# Patient Record
Sex: Female | Born: 1935 | Race: White | Hispanic: No | Smoking: Never smoker
Health system: Southern US, Community
[De-identification: ages and names within clinical notes are randomized; demographics above are authoritative.]

## PROBLEM LIST (undated history)

## (undated) DIAGNOSIS — I872 Venous insufficiency (chronic) (peripheral): Secondary | ICD-10-CM

## (undated) DIAGNOSIS — F419 Anxiety disorder, unspecified: Secondary | ICD-10-CM

## (undated) DIAGNOSIS — H547 Unspecified visual loss: Secondary | ICD-10-CM

## (undated) DIAGNOSIS — I10 Essential (primary) hypertension: Secondary | ICD-10-CM

## (undated) DIAGNOSIS — F32A Depression, unspecified: Secondary | ICD-10-CM

## (undated) DIAGNOSIS — M1990 Unspecified osteoarthritis, unspecified site: Secondary | ICD-10-CM

## (undated) DIAGNOSIS — C50919 Malignant neoplasm of unspecified site of unspecified female breast: Secondary | ICD-10-CM

## (undated) DIAGNOSIS — E119 Type 2 diabetes mellitus without complications: Secondary | ICD-10-CM

## (undated) DIAGNOSIS — I2510 Atherosclerotic heart disease of native coronary artery without angina pectoris: Secondary | ICD-10-CM

## (undated) DIAGNOSIS — E785 Hyperlipidemia, unspecified: Secondary | ICD-10-CM

## (undated) DIAGNOSIS — G629 Polyneuropathy, unspecified: Secondary | ICD-10-CM

## (undated) DIAGNOSIS — E11319 Type 2 diabetes mellitus with unspecified diabetic retinopathy without macular edema: Secondary | ICD-10-CM

## (undated) DIAGNOSIS — H409 Unspecified glaucoma: Secondary | ICD-10-CM

## (undated) DIAGNOSIS — C801 Malignant (primary) neoplasm, unspecified: Secondary | ICD-10-CM

## (undated) HISTORY — PX: CHOLECYSTECTOMY: SHX55

## (undated) HISTORY — DX: Anxiety disorder, unspecified: F41.9

## (undated) HISTORY — DX: Unspecified visual loss: H54.7

## (undated) HISTORY — PX: EYE SURGERY: SHX253

## (undated) HISTORY — DX: Depression, unspecified: F32.A

## (undated) HISTORY — DX: Unspecified osteoarthritis, unspecified site: M19.90

## (undated) HISTORY — DX: Atherosclerotic heart disease of native coronary artery without angina pectoris: I25.10

## (undated) HISTORY — DX: Polyneuropathy, unspecified: G62.9

## (undated) HISTORY — PX: TOE AMPUTATION: SHX809

## (undated) HISTORY — DX: Hyperlipidemia, unspecified: E78.5

## (undated) HISTORY — DX: Venous insufficiency (chronic) (peripheral): I87.2

## (undated) HISTORY — PX: BREAST SURGERY: SHX581

## (undated) HISTORY — DX: Malignant neoplasm of unspecified site of unspecified female breast: C50.919

## (undated) HISTORY — DX: Essential (primary) hypertension: I10

## (undated) HISTORY — DX: Type 2 diabetes mellitus with unspecified diabetic retinopathy without macular edema: E11.319

## (undated) HISTORY — DX: Type 2 diabetes mellitus without complications: E11.9

---

## 2003-05-06 DIAGNOSIS — C801 Malignant (primary) neoplasm, unspecified: Secondary | ICD-10-CM

## 2003-05-06 HISTORY — DX: Malignant (primary) neoplasm, unspecified: C80.1

## 2008-06-27 ENCOUNTER — Observation Stay
Admission: RE | Admit: 2008-06-27 | Disposition: A | Discharge status: Home / Self Care | Payer: Self-pay | Source: Ambulatory Visit | Admitting: Obstetrics and Gynecology

## 2008-06-27 LAB — PERSONNEL HEALTH HCV ANTIBODY: Personel Health HCV Antibody: NEGATIVE

## 2008-06-27 LAB — HIV EXPOSURE FOR SOURCE PATIENTS, RAPID: HIV Source: NONREACTIVE

## 2008-06-27 LAB — PERSONNEL HEALTH HBSAG: Personel Health HBS AG: NEGATIVE

## 2008-09-28 ENCOUNTER — Ambulatory Visit
Admit: 2008-09-28 | Disposition: A | Discharge status: Home / Self Care | Payer: Self-pay | Source: Ambulatory Visit | Admitting: Obstetrics and Gynecology

## 2011-02-21 NOTE — Op Note (Unsigned)
Account Number: [id number]      Document ID: [id number]      Admit Date: 06/27/2008      Procedure Date: 06/27/2008            Patient Location: [id number]      Patient Type: V            SURGEON: Maryjean Ka MD      ASSISTANT:                  PREOPERATIVE DIAGNOSIS:      77 year old with left Bartholin's cyst, possible abscess mass for excision.            POSTOPERATIVE DIAGNOSIS:      Left Bartholin gland and endometrioma excision.            ANESTHESIA:      General.            TITLE OF PROCEDURE:      Excision of left Bartholin gland and cyst, possible endometrioma.            DESCRIPTION OF PROCEDURE:      The patient was placed in a modified lithotomy position.  The part was      cleaned and draped in the usual sterile fashion and examination under      anesthesia revealed large bulbous mass in the location of the Bartholin      gland as well as anteriorly in the labia.  A curvilinear incision was made      at the junction of the labia minora and majora boarding the introitus.      This was about 4 cm.  This was deepened and a plane of the Bartholin cyst      was encountered.  At first it was seen that there were 2 cysts, one in the      upper part and then in the lower part it appeared to be possibly connected,      the upper lobe of the cyst, it appeared to be a bilobed cyst.  The upper      lobe was enucleated by meticulous sharp dissection using a knife and fine      Metzenbaum scissors.  The plane of dissection was obtained, curvilinear,      the cyst was enucleated from the labia majora and minora in the upper part      and as this was posteriorly deepened the lower pole of the cyst was      encountered.  This was enucleated from the labia laterally on the left side      and medially a cyst dissection was done.  The cyst was ruptured and dark      chocolate-colored material was obtained suggesting that the diagnosis of an      endometrioma.  This was held with a Kelly clamp and a further dissection      was  continued by sharp dissection using sharp Metzenbaum scissors.  As the      posterior dissection was continued pedicles to this mass were encountered.      These were clamped using fine Kelly clamps and cutting off with the      scissors, and tying off with sutures of 2-0 Vicryl and a transfixation      sutures.  Complete hemostasis was assured.  Finally, the cyst was removed.      This meticulous dissection was required at least 2 hours of  work.  Once  this was done, the bed of the cyst was encountered.  Complete hemostasis      was assured and the bed was closed with figure-of-eight stitches.  Finally      the vaginal mucosa was reapproximated with a continuous suture of 2-0      Vicryl on an SH suture.  Complete hemostasis was assured, a Foley catheter      was passed.  In view of the massive dissection and deep intrusion of this      Bartholin's gland oblique endometrioma the patient was admitted for 23-hour      observation period and will be discharged home tomorrow.                              _______________________________     Date/Time Signed: _____________      Maryjean Ka MD [phone])            D:  06/27/2008 22:31 PM by Dr. Denita Lung. Thedore Mins, MD [phone])      T:  06/28/2008 11:02 AM by Ottis Stain          (Conf: [id number]) (Doc ID: [id number])                  NU:[id number] Thedore Mins MD

## 2011-05-27 ENCOUNTER — Encounter (INDEPENDENT_AMBULATORY_CARE_PROVIDER_SITE_OTHER): Payer: Self-pay

## 2014-08-25 ENCOUNTER — Encounter (HOSPITAL_BASED_OUTPATIENT_CLINIC_OR_DEPARTMENT_OTHER): Payer: Self-pay | Admitting: *Deleted

## 2014-08-25 ENCOUNTER — Emergency Department (HOSPITAL_BASED_OUTPATIENT_CLINIC_OR_DEPARTMENT_OTHER): Payer: Medicare PPO

## 2014-08-25 ENCOUNTER — Inpatient Hospital Stay (HOSPITAL_BASED_OUTPATIENT_CLINIC_OR_DEPARTMENT_OTHER)
Admission: EM | Admit: 2014-08-25 | Discharge: 2014-08-27 | DRG: 637 | Disposition: A | Discharge status: Home Health | Payer: Medicare PPO | Attending: Internal Medicine | Admitting: Internal Medicine

## 2014-08-25 DIAGNOSIS — M1990 Unspecified osteoarthritis, unspecified site: Secondary | ICD-10-CM | POA: Diagnosis present

## 2014-08-25 DIAGNOSIS — I2510 Atherosclerotic heart disease of native coronary artery without angina pectoris: Secondary | ICD-10-CM | POA: Diagnosis present

## 2014-08-25 DIAGNOSIS — E1140 Type 2 diabetes mellitus with diabetic neuropathy, unspecified: Secondary | ICD-10-CM | POA: Diagnosis present

## 2014-08-25 DIAGNOSIS — R531 Weakness: Secondary | ICD-10-CM

## 2014-08-25 DIAGNOSIS — I10 Essential (primary) hypertension: Secondary | ICD-10-CM | POA: Diagnosis present

## 2014-08-25 DIAGNOSIS — R7989 Other specified abnormal findings of blood chemistry: Secondary | ICD-10-CM

## 2014-08-25 DIAGNOSIS — I9589 Other hypotension: Secondary | ICD-10-CM | POA: Diagnosis not present

## 2014-08-25 DIAGNOSIS — H5440 Blindness, one eye, unspecified eye: Secondary | ICD-10-CM | POA: Diagnosis present

## 2014-08-25 DIAGNOSIS — R739 Hyperglycemia, unspecified: Secondary | ICD-10-CM | POA: Diagnosis not present

## 2014-08-25 DIAGNOSIS — H409 Unspecified glaucoma: Secondary | ICD-10-CM | POA: Diagnosis present

## 2014-08-25 DIAGNOSIS — E860 Dehydration: Secondary | ICD-10-CM | POA: Diagnosis present

## 2014-08-25 DIAGNOSIS — Z79899 Other long term (current) drug therapy: Secondary | ICD-10-CM

## 2014-08-25 DIAGNOSIS — H5441 Blindness, right eye, normal vision left eye: Secondary | ICD-10-CM | POA: Diagnosis present

## 2014-08-25 DIAGNOSIS — G9340 Encephalopathy, unspecified: Secondary | ICD-10-CM | POA: Diagnosis present

## 2014-08-25 DIAGNOSIS — E11319 Type 2 diabetes mellitus with unspecified diabetic retinopathy without macular edema: Secondary | ICD-10-CM | POA: Diagnosis present

## 2014-08-25 DIAGNOSIS — N179 Acute kidney failure, unspecified: Secondary | ICD-10-CM | POA: Diagnosis present

## 2014-08-25 DIAGNOSIS — Z794 Long term (current) use of insulin: Secondary | ICD-10-CM | POA: Diagnosis not present

## 2014-08-25 DIAGNOSIS — IMO0002 Reserved for concepts with insufficient information to code with codable children: Secondary | ICD-10-CM | POA: Diagnosis present

## 2014-08-25 DIAGNOSIS — E1165 Type 2 diabetes mellitus with hyperglycemia: Principal | ICD-10-CM | POA: Diagnosis present

## 2014-08-25 HISTORY — DX: Atherosclerotic heart disease of native coronary artery without angina pectoris: I25.10

## 2014-08-25 HISTORY — DX: Unspecified glaucoma: H40.9

## 2014-08-25 HISTORY — DX: Essential (primary) hypertension: I10

## 2014-08-25 HISTORY — DX: Type 2 diabetes mellitus without complications: E11.9

## 2014-08-25 HISTORY — DX: Unspecified osteoarthritis, unspecified site: M19.90

## 2014-08-25 LAB — URINALYSIS, ROUTINE W REFLEX MICROSCOPIC
Bilirubin Urine: NEGATIVE
Glucose, UA: >1000 mg/dL — AB
Hgb urine dipstick: NEGATIVE
Ketones, ur: NEGATIVE mg/dL
LEUKOCYTES UA: NEGATIVE
NITRITE: NEGATIVE
PROTEIN: 30 mg/dL — AB
SPECIFIC GRAVITY, URINE: 1.026 (ref 1.005–1.030)
Urobilinogen, UA: 1 mg/dL (ref 0.0–1.0)
pH: 7 (ref 5.0–8.0)

## 2014-08-25 LAB — I-STAT VENOUS BLOOD GAS, ED
ACID-BASE EXCESS: 1 mmol/L (ref 0.0–2.0)
BICARBONATE: 28 meq/L — AB (ref 20.0–24.0)
O2 SAT: 17 %
Patient temperature: 98.9
TCO2: 30 mmol/L (ref 0–100)
pCO2, Ven: 54.6 mmHg — ABNORMAL HIGH (ref 45.0–50.0)
pH, Ven: 7.319 — ABNORMAL HIGH (ref 7.250–7.300)
pO2, Ven: 15 mmHg — CL (ref 30.0–45.0)

## 2014-08-25 LAB — CBC WITH DIFFERENTIAL/PLATELET
BASOS PCT: 0 % (ref 0–1)
Basophils Absolute: 0 10*3/uL (ref 0.0–0.1)
EOS ABS: 0.2 10*3/uL (ref 0.0–0.7)
Eosinophils Relative: 2 % (ref 0–5)
HEMATOCRIT: 40.3 % (ref 36.0–46.0)
HEMOGLOBIN: 13.8 g/dL (ref 12.0–15.0)
Lymphocytes Relative: 18 % (ref 12–46)
Lymphs Abs: 2.2 10*3/uL (ref 0.7–4.0)
MCH: 31.4 pg (ref 26.0–34.0)
MCHC: 34.2 g/dL (ref 30.0–36.0)
MCV: 91.8 fL (ref 78.0–100.0)
MONOS PCT: 7 % (ref 3–12)
Monocytes Absolute: 0.9 10*3/uL (ref 0.1–1.0)
NEUTROS PCT: 73 % (ref 43–77)
Neutro Abs: 9.1 10*3/uL — ABNORMAL HIGH (ref 1.7–7.7)
Platelets: 190 10*3/uL (ref 150–400)
RBC: 4.39 MIL/uL (ref 3.87–5.11)
RDW: 12.3 % (ref 11.5–15.5)
WBC: 12.4 10*3/uL — ABNORMAL HIGH (ref 4.0–10.5)

## 2014-08-25 LAB — COMPREHENSIVE METABOLIC PANEL
ALT: 19 U/L (ref 0–35)
AST: 24 U/L (ref 0–37)
Albumin: 3.8 g/dL (ref 3.5–5.2)
Alkaline Phosphatase: 109 U/L (ref 39–117)
Anion gap: 11 (ref 5–15)
BUN: 32 mg/dL — ABNORMAL HIGH (ref 6–23)
CO2: 29 mmol/L (ref 19–32)
Calcium: 9.2 mg/dL (ref 8.4–10.5)
Chloride: 84 mmol/L — ABNORMAL LOW (ref 96–112)
Creatinine, Ser: 1.32 mg/dL — ABNORMAL HIGH (ref 0.50–1.10)
GFR calc Af Amer: 43 mL/min — ABNORMAL LOW (ref >90)
GFR, EST NON AFRICAN AMERICAN: 37 mL/min — AB (ref >90)
Glucose, Bld: 575 mg/dL (ref 70–99)
Potassium: 4.8 mmol/L (ref 3.5–5.1)
SODIUM: 124 mmol/L — AB (ref 135–145)
Total Bilirubin: 0.9 mg/dL (ref 0.3–1.2)
Total Protein: 8.1 g/dL (ref 6.0–8.3)

## 2014-08-25 LAB — CBG MONITORING, ED
GLUCOSE-CAPILLARY: 546 mg/dL — AB (ref 70–99)
GLUCOSE-CAPILLARY: 407 mg/dL — AB (ref 70–99)
GLUCOSE-CAPILLARY: 212 mg/dL — AB (ref 70–99)
GLUCOSE-CAPILLARY: 141 mg/dL — AB (ref 70–99)
Glucose-Capillary: 478 mg/dL — ABNORMAL HIGH (ref 70–99)

## 2014-08-25 LAB — URINE MICROSCOPIC-ADD ON

## 2014-08-25 LAB — GLUCOSE, CAPILLARY
Glucose-Capillary: 141 mg/dL — ABNORMAL HIGH (ref 70–99)
Glucose-Capillary: 142 mg/dL — ABNORMAL HIGH (ref 70–99)
Glucose-Capillary: 209 mg/dL — ABNORMAL HIGH (ref 70–99)

## 2014-08-25 LAB — D-DIMER, QUANTITATIVE: D-Dimer, Quant: 0.65 ug/mL-FEU — ABNORMAL HIGH (ref 0.00–0.48)

## 2014-08-25 LAB — CK TOTAL AND CKMB (NOT AT ARMC)
CK TOTAL: 40 U/L (ref 7–177)
CK, MB: 2.6 ng/mL (ref 0.3–4.0)
RELATIVE INDEX: INVALID (ref 0.0–2.5)

## 2014-08-25 LAB — I-STAT CG4 LACTIC ACID, ED: LACTIC ACID, VENOUS: 2.25 mmol/L — AB (ref 0.5–2.0)

## 2014-08-25 LAB — SEDIMENTATION RATE: Sed Rate: 104 mm/hr — ABNORMAL HIGH (ref 0–22)

## 2014-08-25 LAB — TROPONIN I
Troponin I: <0.03 ng/mL (ref <0.031)
Troponin I: <0.03 ng/mL (ref <0.031)

## 2014-08-25 LAB — MRSA PCR SCREENING: MRSA by PCR: NEGATIVE

## 2014-08-25 MED ORDER — SODIUM CHLORIDE 0.9 % IV SOLN
INTRAVENOUS | Status: DC
  Administered 2014-08-25: 15:00:00 via INTRAVENOUS

## 2014-08-25 MED ORDER — SODIUM CHLORIDE 0.9 % IV SOLN
Freq: Once | INTRAVENOUS | Status: AC
  Administered 2014-08-25: 13:00:00 via INTRAVENOUS

## 2014-08-25 MED ORDER — SODIUM CHLORIDE 0.9 % IV SOLN
INTRAVENOUS | Status: DC
  Administered 2014-08-25: 4.9 [IU]/h via INTRAVENOUS

## 2014-08-25 MED ORDER — LATANOPROST 0.005 % OP SOLN
1.0000 [drp] | Freq: Every day | OPHTHALMIC | Status: DC
Start: 2014-08-25 — End: 2014-08-27
  Administered 2014-08-26 – 2014-08-27 (×2): 1 [drp] via OPHTHALMIC
  Filled 2014-08-25 (×2): qty 2.5

## 2014-08-25 MED ORDER — ACETAMINOPHEN 650 MG RE SUPP: 650.0000 mg | Freq: Four times a day (QID) | RECTAL | Status: DC | PRN

## 2014-08-25 MED ORDER — PRAVASTATIN SODIUM 10 MG PO TABS
40.0000 mg | ORAL_TABLET | Freq: Every day | ORAL | Status: DC
  Administered 2014-08-26: 40 mg via ORAL
  Filled 2014-08-25: qty 1
  Filled 2014-08-25: qty 4

## 2014-08-25 MED ORDER — DEXTROSE-NACL 5-0.45 % IV SOLN: INTRAVENOUS | Status: DC

## 2014-08-25 MED ORDER — PIPERACILLIN-TAZOBACTAM 3.375 G IVPB
3.3750 g | Freq: Three times a day (TID) | INTRAVENOUS | Status: DC
  Filled 2014-08-25 (×2): qty 50

## 2014-08-25 MED ORDER — PIPERACILLIN-TAZOBACTAM 3.375 G IVPB 30 MIN
3.3750 g | Freq: Once | INTRAVENOUS | Status: AC
  Administered 2014-08-25: 3.375 g via INTRAVENOUS
  Filled 2014-08-25 (×2): qty 50

## 2014-08-25 MED ORDER — INSULIN ASPART 100 UNIT/ML ~~LOC~~ SOLN
0.0000 [IU] | Freq: Every day | SUBCUTANEOUS | Status: DC
  Administered 2014-08-25 – 2014-08-26 (×2): 2 [IU] via SUBCUTANEOUS

## 2014-08-25 MED ORDER — SODIUM CHLORIDE 0.9 % IV BOLUS (SEPSIS)
1000.0000 mL | Freq: Once | INTRAVENOUS | Status: AC
  Administered 2014-08-25: 1000 mL via INTRAVENOUS

## 2014-08-25 MED ORDER — VANCOMYCIN HCL IN DEXTROSE 1-5 GM/200ML-% IV SOLN
1000.0000 mg | Freq: Once | INTRAVENOUS | Status: AC
Start: 2014-08-25 — End: 2014-08-25
  Administered 2014-08-25: 1000 mg via INTRAVENOUS
  Filled 2014-08-25: qty 200

## 2014-08-25 MED ORDER — SODIUM CHLORIDE 0.9 % IJ SOLN
3.0000 mL | Freq: Two times a day (BID) | INTRAMUSCULAR | Status: DC
  Administered 2014-08-25 – 2014-08-26 (×2): 3 mL via INTRAVENOUS

## 2014-08-25 MED ORDER — GABAPENTIN 600 MG PO TABS
600.0000 mg | ORAL_TABLET | Freq: Three times a day (TID) | ORAL | Status: DC
  Administered 2014-08-26 – 2014-08-27 (×5): 600 mg via ORAL
  Filled 2014-08-25 (×7): qty 1

## 2014-08-25 MED ORDER — SODIUM CHLORIDE 0.9 % IV SOLN
INTRAVENOUS | Status: AC
  Administered 2014-08-25 – 2014-08-26 (×2): via INTRAVENOUS

## 2014-08-25 MED ORDER — INSULIN ASPART 100 UNIT/ML ~~LOC~~ SOLN: 0.0000 [IU] | Freq: Three times a day (TID) | SUBCUTANEOUS | Status: DC

## 2014-08-25 MED ORDER — VANCOMYCIN HCL 10 G IV SOLR
1250.0000 mg | INTRAVENOUS | Status: DC
  Filled 2014-08-25: qty 1250

## 2014-08-25 MED ORDER — ACETAMINOPHEN 325 MG PO TABS
650.0000 mg | ORAL_TABLET | Freq: Four times a day (QID) | ORAL | Status: DC | PRN
Start: 2014-08-25 — End: 2014-08-27

## 2014-08-25 MED ORDER — INSULIN ASPART 100 UNIT/ML ~~LOC~~ SOLN
0.0000 [IU] | Freq: Three times a day (TID) | SUBCUTANEOUS | Status: DC
  Administered 2014-08-26: 3 [IU] via SUBCUTANEOUS
  Administered 2014-08-26: 8 [IU] via SUBCUTANEOUS
  Administered 2014-08-26: 11 [IU] via SUBCUTANEOUS
  Administered 2014-08-27: 3 [IU] via SUBCUTANEOUS

## 2014-08-25 MED ORDER — METOPROLOL SUCCINATE ER 50 MG PO TB24
50.0000 mg | ORAL_TABLET | Freq: Every day | ORAL | Status: DC
  Administered 2014-08-26 – 2014-08-27 (×3): 50 mg via ORAL
  Filled 2014-08-25 (×3): qty 1

## 2014-08-25 MED ORDER — LOSARTAN POTASSIUM 50 MG PO TABS
100.0000 mg | ORAL_TABLET | Freq: Every day | ORAL | Status: DC
  Administered 2014-08-26 – 2014-08-27 (×3): 100 mg via ORAL
  Filled 2014-08-25 (×3): qty 2

## 2014-08-25 MED ORDER — TRAMADOL HCL 50 MG PO TABS
100.0000 mg | ORAL_TABLET | Freq: Four times a day (QID) | ORAL | Status: DC | PRN
  Administered 2014-08-26 – 2014-08-27 (×3): 100 mg via ORAL
  Filled 2014-08-25 (×3): qty 2

## 2014-08-25 MED ORDER — TIMOLOL MALEATE 0.5 % OP SOLN
1.0000 [drp] | Freq: Every day | OPHTHALMIC | Status: DC
  Administered 2014-08-26 – 2014-08-27 (×2): 1 [drp] via OPHTHALMIC
  Filled 2014-08-25: qty 5

## 2014-08-25 MED ORDER — ENOXAPARIN SODIUM 30 MG/0.3ML ~~LOC~~ SOLN
30.0000 mg | SUBCUTANEOUS | Status: DC
  Administered 2014-08-26: 30 mg via SUBCUTANEOUS
  Filled 2014-08-25 (×3): qty 0.3

## 2014-08-25 NOTE — ED Notes (Addendum)
EMS reports the patient is coming from.  Weakness for approximately three days, positive for orthostatics vital signs.  C/o frequent urination, burning and dark colored urine.  CBG 555, VS 134/83,  HR 94, 12 lead unremarkable.  Reports last week patient was diagnosed with flu like symptoms which have resolved.  IV started and 150 cc Normal Saline given by ems.

## 2014-08-25 NOTE — H&P (Signed)
Natasha Sexton is an 81 y.o. female.    Pcp:  [name]   Chief Complaint: weakness HPI: 81 yo female with hx of dm2, cad c/o sore throat, and drank some orange juice and her sugar went up and her nurse came in and checked her bs and it was high and sent her to ED.  In ED  bs 575,  No ketones.  Pt appeared to be in very mild ARF,  And dehydrated.  Pt will be admitted for hyperglycemia, and arf.   Past Medical History  Diagnosis Date  . Diabetes mellitus without complication   . Hypertension   . Coronary artery disease   . Arthritis   . Glaucoma     Past Surgical History  Procedure Laterality Date  . Cholecystectomy      Family History  Problem Relation Age of Onset  . Stroke Father   . Diabetes Mother    Social History:  reports that she has never smoked. She does not have any smokeless tobacco history on file. She reports that she does not drink alcohol or use illicit drugs.  Allergies: No Known Allergies  Medications Prior to Admission  Medication Sig Dispense Refill  . gabapentin (NEURONTIN) 100 MG capsule Take 100 mg by mouth 3 (three) times daily.    . insulin aspart (NOVOLOG) 100 UNIT/ML injection Inject into the skin 3 (three) times daily with meals.    . insulin glargine (LANTUS) 100 UNIT/ML injection Inject 49 Units into the skin 2 (two) times daily.    Marland Kitchen losartan (COZAAR) 100 MG tablet Take 100 mg by mouth daily.    . metoprolol succinate (TOPROL-XL) 50 MG 24 hr tablet Take 50 mg by mouth daily. Take with or immediately following a meal.    . traMADol (ULTRAM) 50 MG tablet Take 100 mg by mouth every 6 (six) hours as needed.    . Travoprost, BAK Free, (TRAVATAN) 0.004 % SOLN ophthalmic solution 1 drop at bedtime.    . B-D ULTRAFINE III SHORT PEN 31G X 8 MM MISC See admin instructions.  1  . gabapentin (NEURONTIN) 600 MG tablet Take 600 mg by mouth 3 (three) times daily.  1  . LANTUS SOLOSTAR 100 UNIT/ML Solostar Pen   1  . latanoprost (XALATAN) 0.005 %  ophthalmic solution Place 1 drop into both eyes at bedtime.  6  . losartan-hydrochlorothiazide (HYZAAR) 100-25 MG per tablet Take by mouth daily.  1  . lovastatin (MEVACOR) 40 MG tablet Take 40 mg by mouth at bedtime.  1  . NOVOFINE PLUS 32G X 4 MM MISC   6  . omega-3 acid ethyl esters (LOVAZA) 1 G capsule Take 1 g by mouth.  11  . timolol (TIMOPTIC) 0.5 % ophthalmic solution Place 1 drop into the left eye every morning. Use the same time every morning.  5    Results for orders placed or performed during the hospital encounter of 08/25/14 (from the past 48 hour(s))  POC CBG, ED     Status: Abnormal   Collection Time: 08/25/14 11:07 AM  Result Value Ref Range   Glucose-Capillary >600 (HH) 70 - 99 mg/dL   Comment 1 Notify RN   CBC WITH DIFFERENTIAL     Status: Abnormal   Collection Time: 08/25/14 11:20 AM  Result Value Ref Range   WBC 12.4 (H) 4.0 - 10.5 K/uL   RBC 4.39 3.87 - 5.11 MIL/uL   Hemoglobin 13.8 12.0 - 15.0 g/dL   HCT 40.3 36.0 -  46.0 %   MCV 91.8 78.0 - 100.0 fL   MCH 31.4 26.0 - 34.0 pg   MCHC 34.2 30.0 - 36.0 g/dL   RDW 12.3 11.5 - 15.5 %   Platelets 190 150 - 400 K/uL   Neutrophils Relative % 73 43 - 77 %   Neutro Abs 9.1 (H) 1.7 - 7.7 K/uL   Lymphocytes Relative 18 12 - 46 %   Lymphs Abs 2.2 0.7 - 4.0 K/uL   Monocytes Relative 7 3 - 12 %   Monocytes Absolute 0.9 0.1 - 1.0 K/uL   Eosinophils Relative 2 0 - 5 %   Eosinophils Absolute 0.2 0.0 - 0.7 K/uL   Basophils Relative 0 0 - 1 %   Basophils Absolute 0.0 0.0 - 0.1 K/uL  Comprehensive metabolic panel     Status: Abnormal   Collection Time: 08/25/14 11:20 AM  Result Value Ref Range   Sodium 124 (L) 135 - 145 mmol/L   Potassium 4.8 3.5 - 5.1 mmol/L   Chloride 84 (L) 96 - 112 mmol/L   CO2 29 19 - 32 mmol/L   Glucose, Bld 575 (HH) 70 - 99 mg/dL    Comment: CRITICAL RESULT CALLED TO, READ BACK BY AND VERIFIED WITH: TERI MASTEN RN _0  08/25/2014 OLSONM REPEATED TO VERIFY    BUN 32 (H) 6 - 23 mg/dL    Creatinine, Ser 1.32 (H) 0.50 - 1.10 mg/dL   Calcium 9.2 8.4 - 10.5 mg/dL   Total Protein 8.1 6.0 - 8.3 g/dL   Albumin 3.8 3.5 - 5.2 g/dL   AST 24 0 - 37 U/L   ALT 19 0 - 35 U/L   Alkaline Phosphatase 109 39 - 117 U/L   Total Bilirubin 0.9 0.3 - 1.2 mg/dL   GFR calc non Af Amer 37 (L) >90 mL/min   GFR calc Af Amer 43 (L) >90 mL/min    Comment: (NOTE) The eGFR has been calculated using the CKD EPI equation. This calculation has not been validated in all clinical situations. eGFR's persistently <90 mL/min signify possible Chronic Kidney Disease.    Anion gap 11 5 - 15  Troponin I     Status: None   Collection Time: 08/25/14 11:20 AM  Result Value Ref Range   Troponin I <0.03 <0.031 ng/mL    Comment:        NO INDICATION OF MYOCARDIAL INJURY.   I-Stat CG4 Lactic Acid, ED (not at [facility])     Status: Abnormal   Collection Time: 08/25/14 11:37 AM  Result Value Ref Range   Lactic Acid, Venous 2.25 (HH) 0.5 - 2.0 mmol/L   Comment NOTIFIED PHYSICIAN   CBG monitoring, ED     Status: Abnormal   Collection Time: 08/25/14 12:04 PM  Result Value Ref Range   Glucose-Capillary 546 (H) 70 - 99 mg/dL   Comment 1 Notify RN   I-Stat venous blood gas, ED     Status: Abnormal   Collection Time: 08/25/14 12:08 PM  Result Value Ref Range   pH, Ven 7.319 (H) 7.250 - 7.300   pCO2, Ven 54.6 (H) 45.0 - 50.0 mmHg   pO2, Ven 15.0 (LL) 30.0 - 45.0 mmHg   Bicarbonate 28.0 (H) 20.0 - 24.0 mEq/L   TCO2 30 0 - 100 mmol/L   O2 Saturation 17.0 %   Acid-Base Excess 1.0 0.0 - 2.0 mmol/L   Patient temperature 98.9 F    Collection site IV START    Drawn by Mining engineer  Sample type VENOUS    Comment NOTIFIED PHYSICIAN   Urinalysis, Routine w reflex microscopic     Status: Abnormal   Collection Time: 08/25/14 12:10 PM  Result Value Ref Range   Color, Urine YELLOW YELLOW   APPearance CLOUDY (A) CLEAR   Specific Gravity, Urine 1.026 1.005 - 1.030   pH 7.0 5.0 - 8.0   Glucose, UA >1000 (A) NEGATIVE mg/dL    Hgb urine dipstick NEGATIVE NEGATIVE   Bilirubin Urine NEGATIVE NEGATIVE   Ketones, ur NEGATIVE NEGATIVE mg/dL   Protein, ur 30 (A) NEGATIVE mg/dL   Urobilinogen, UA 1.0 0.0 - 1.0 mg/dL   Nitrite NEGATIVE NEGATIVE   Leukocytes, UA NEGATIVE NEGATIVE  Urine microscopic-add on     Status: Abnormal   Collection Time: 08/25/14 12:10 PM  Result Value Ref Range   Squamous Epithelial / LPF RARE RARE   WBC, UA 0-2 <3 WBC/hpf   RBC / HPF 0-2 <3 RBC/hpf   Bacteria, UA MANY (A) RARE  CBG monitoring, ED     Status: Abnormal   Collection Time: 08/25/14  1:27 PM  Result Value Ref Range   Glucose-Capillary 478 (H) 70 - 99 mg/dL  CBG monitoring, ED     Status: Abnormal   Collection Time: 08/25/14  2:47 PM  Result Value Ref Range   Glucose-Capillary 407 (H) 70 - 99 mg/dL  CBG monitoring, ED     Status: Abnormal   Collection Time: 08/25/14  4:12 PM  Result Value Ref Range   Glucose-Capillary 212 (H) 70 - 99 mg/dL  CBG monitoring, ED     Status: Abnormal   Collection Time: 08/25/14  4:57 PM  Result Value Ref Range   Glucose-Capillary 141 (H) 70 - 99 mg/dL  Glucose, capillary     Status: Abnormal   Collection Time: 08/25/14  6:10 PM  Result Value Ref Range   Glucose-Capillary 141 (H) 70 - 99 mg/dL   Dg Chest 2 View  08/25/2014   CLINICAL DATA:  Weakness 3 days.  EXAM: CHEST  2 VIEW  COMPARISON:  None.  FINDINGS: Elevated right hemidiaphragm with mild atelectasis right middle lobe. Negative for pneumonia. Negative for heart failure or mass lesion.  IMPRESSION: No active cardiopulmonary disease.   Electronically Signed   By: Franchot Gallo M.D.   On: 08/25/2014 12:14   Ct Head Wo Contrast  08/25/2014   CLINICAL DATA:  Weakness for 3 days, frequent urination  EXAM: CT HEAD WITHOUT CONTRAST  TECHNIQUE: Contiguous axial images were obtained from the base of the skull through the vertex without intravenous contrast.  COMPARISON:  None.  FINDINGS: No hemorrhage or extra-axial fluid. No hydrocephalus.  Moderately severe atrophy and low attenuation diffusely in the deep white matter consistent with chronic small vessel ischemic change. Visualized portions of the paranasal sinuses clear. Calvarium intact.  IMPRESSION: Age-related involutional change with no acute findings   Electronically Signed   By: Skipper Cliche M.D.   On: 08/25/2014 13:35    Review of Systems  Constitutional: Negative for fever, chills, weight loss, malaise/fatigue and diaphoresis.  HENT: Negative.   Eyes: Negative.   Respiratory: Positive for shortness of breath. Negative for cough, hemoptysis, sputum production and wheezing.   Cardiovascular: Negative.   Gastrointestinal: Negative.   Genitourinary: Negative.   Musculoskeletal: Negative.   Skin: Negative.   Neurological: Positive for weakness. Negative for dizziness, tingling, tremors, sensory change, speech change, focal weakness, seizures and loss of consciousness.  Endo/Heme/Allergies: Negative.   Psychiatric/Behavioral: Negative.  Blood pressure 111/79, pulse 94, temperature 98.4 F (36.9 C), temperature source Oral, resp. rate 15, height _0  (1.651 m), weight 83.553 kg (184 lb 3.2 oz), SpO2 100 %. Physical Exam  Constitutional: She is oriented to person, place, and time. She appears well-developed and well-nourished.  HENT:  Head: Normocephalic and atraumatic.  Mouth/Throat: No oropharyngeal exudate.  Eyes: Conjunctivae and EOM are normal. Pupils are equal, round, and reactive to light. No scleral icterus.  Neck: Normal range of motion. Neck supple. No JVD present. No tracheal deviation present. No thyromegaly present.  Cardiovascular: Normal rate and regular rhythm.  Exam reveals no gallop and no friction rub.   No murmur heard. Respiratory: Effort normal and breath sounds normal. No respiratory distress. She has no wheezes. She has no rales.  GI: Soft. Bowel sounds are normal. She exhibits no distension. There is no tenderness. There is no rebound and  no guarding.  Musculoskeletal: Normal range of motion. She exhibits no edema or tenderness.  Lymphadenopathy:    She has no cervical adenopathy.  Neurological: She is alert and oriented to person, place, and time. She has normal reflexes. She displays normal reflexes. No cranial nerve deficit. She exhibits normal muscle tone. Coordination normal.  Skin: Skin is warm and dry. No rash noted. No erythema. No pallor.  Psychiatric: She has a normal mood and affect. Her behavior is normal. Judgment and thought content normal.     Assessment/Plan Hyperglycemia Pt was started on iv insulin and ns i bs improved,  No dka,   Start on fsbs ac and qhs, iss sensitive  Generalized weakness x 3 days  ? Due to hyperglycemia, dehydration Check cpk tsh, esr  ARF Hydrate with ns iv Hold hydrochlorothiazide/? Pt denies nsaid use  Dyspnea Check bnp Check d dimer If positive VQ scan  Hypertension D/c hydrochlorothiazide Cont losartan for now Cont metoprolol If renal insufficiency not improving, d/c losartan  Dm2 Holding lantus temporarily  Diabetic neuropathy Cont gabapentin  Glaucoma Cont current medications  DVT: scd, lovenox   Jani Gravel 08/25/2014, 7:50 PM

## 2014-08-25 NOTE — ED Notes (Signed)
Pt initiated on the [employer]

## 2014-08-25 NOTE — ED Provider Notes (Signed)
CSN: [id number]     Arrival date & time 08/25/14  1032 History   First MD Initiated Contact with Patient 08/25/14 1127     Chief Complaint  Patient presents with  . Weakness     (Consider location/radiation/quality/duration/timing/severity/associated sxs/prior Treatment) Patient is a 81 y.o. female presenting with weakness. The history is provided by the patient, a caregiver and the EMS personnel. The history is limited by the condition of the patient.  Weakness Pertinent negatives include no chest pain, no abdominal pain, no headaches and no shortness of breath.   patient lives at home by herself has a caregiver. Caregiver noted today when she came in the patient seemed out of it seemed to be generally weak all over. Has been some concern she hasn't been taking her diabetic medicine or eating improperly. Caregiver says there was no history of fevers nausea vomiting or complaint of chest pain shortness of breath or belly pain recently. Patient was seen last week for a flulike illness.  Past Medical History  Diagnosis Date  . Diabetes mellitus without complication   . Hypertension   . Coronary artery disease   . Arthritis    No past surgical history on file. No family history on file. History  Substance Use Topics  . Smoking status: Current Every Day Smoker  . Smokeless tobacco: Not on file  . Alcohol Use: Not on file   OB History    No data available     Review of Systems  Constitutional: Positive for fatigue. Negative for fever.  HENT: Negative for congestion.   Eyes: Negative for redness.  Respiratory: Negative for shortness of breath.   Cardiovascular: Negative for chest pain.  Gastrointestinal: Negative for nausea, vomiting and abdominal pain.  Genitourinary: Negative for dysuria.  Musculoskeletal: Negative for myalgias.  Skin: Negative for rash.  Neurological: Positive for weakness. Negative for headaches.  Hematological: Does not bruise/bleed easily.   Psychiatric/Behavioral: Positive for confusion.      Allergies  Review of patient's allergies indicates no known allergies.  Home Medications   Prior to Admission medications   Medication Sig Start Date End Date Taking? Authorizing Provider  gabapentin (NEURONTIN) 100 MG capsule Take 100 mg by mouth 3 (three) times daily.   Yes Historical Provider, MD  insulin aspart (NOVOLOG) 100 UNIT/ML injection Inject into the skin 3 (three) times daily with meals.   Yes Historical Provider, MD  insulin glargine (LANTUS) 100 UNIT/ML injection Inject 49 Units into the skin 2 (two) times daily.   Yes Historical Provider, MD  losartan (COZAAR) 100 MG tablet Take 100 mg by mouth daily.   Yes Historical Provider, MD  metoprolol succinate (TOPROL-XL) 50 MG 24 hr tablet Take 50 mg by mouth daily. Take with or immediately following a meal.   Yes Historical Provider, MD  traMADol (ULTRAM) 50 MG tablet Take 100 mg by mouth every 6 (six) hours as needed.   Yes Historical Provider, MD  Travoprost, BAK Free, (TRAVATAN) 0.004 % SOLN ophthalmic solution 1 drop at bedtime.   Yes Historical Provider, MD   BP 133/69 mmHg  Pulse 80  Temp(Src) 98.9 F (37.2 C)  Resp 12  Ht 5\' 4"  (1.626 m)  Wt 202 lb (91.627 kg)  BMI 34.66 kg/m2  SpO2 97% Physical Exam  Constitutional: She appears well-developed and well-nourished. No distress.  HENT:  Head: Normocephalic.  Mucous membranes are slightly dry.  Eyes: Conjunctivae are normal. Pupils are equal, round, and reactive to light.  Neck: Normal range of  motion.  Cardiovascular: Normal rate, regular rhythm and normal heart sounds.   No murmur heard. Pulmonary/Chest: Effort normal and breath sounds normal. No respiratory distress.  Abdominal: Soft. Bowel sounds are normal. There is no tenderness.  Musculoskeletal: Normal range of motion. She exhibits no edema.  Neurological: She is alert. No cranial nerve deficit. She exhibits normal muscle tone. Coordination normal.   But confused.  Skin: Skin is warm. No rash noted.  Nursing note and vitals reviewed.   ED Course  Procedures (including critical care time) Labs Review Labs Reviewed  CBC WITH DIFFERENTIAL/PLATELET - Abnormal; Notable for the following:    WBC 12.4 (*)    Neutro Abs 9.1 (*)    All other components within normal limits  COMPREHENSIVE METABOLIC PANEL - Abnormal; Notable for the following:    Sodium 124 (*)    Chloride 84 (*)    Glucose, Bld 575 (*)    BUN 32 (*)    Creatinine, Ser 1.32 (*)    GFR calc non Af Amer 37 (*)    GFR calc Af Amer 43 (*)    All other components within normal limits  URINALYSIS, ROUTINE W REFLEX MICROSCOPIC - Abnormal; Notable for the following:    APPearance CLOUDY (*)    Glucose, UA >1000 (*)    Protein, ur 30 (*)    All other components within normal limits  URINE MICROSCOPIC-ADD ON - Abnormal; Notable for the following:    Bacteria, UA MANY (*)    All other components within normal limits  I-STAT CG4 LACTIC ACID, ED - Abnormal; Notable for the following:    Lactic Acid, Venous 2.25 (*)    All other components within normal limits  CBG MONITORING, ED - Abnormal; Notable for the following:    Glucose-Capillary >600 (*)    All other components within normal limits  CBG MONITORING, ED - Abnormal; Notable for the following:    Glucose-Capillary 546 (*)    All other components within normal limits  I-STAT VENOUS BLOOD GAS, ED - Abnormal; Notable for the following:    pH, Ven 7.319 (*)    pCO2, Ven 54.6 (*)    pO2, Ven 15.0 (*)    Bicarbonate 28.0 (*)    All other components within normal limits  CBG MONITORING, ED - Abnormal; Notable for the following:    Glucose-Capillary 478 (*)    All other components within normal limits  CULTURE, BLOOD (ROUTINE X 2)  CULTURE, BLOOD (ROUTINE X 2)  URINE CULTURE  TROPONIN I  I-STAT CG4 LACTIC ACID, ED   Results for orders placed or performed during the hospital encounter of 08/25/14  CBC WITH  DIFFERENTIAL  Result Value Ref Range   WBC 12.4 (H) 4.0 - 10.5 K/uL   RBC 4.39 3.87 - 5.11 MIL/uL   Hemoglobin 13.8 12.0 - 15.0 g/dL   HCT 40.3 36.0 - 46.0 %   MCV 91.8 78.0 - 100.0 fL   MCH 31.4 26.0 - 34.0 pg   MCHC 34.2 30.0 - 36.0 g/dL   RDW 12.3 11.5 - 15.5 %   Platelets 190 150 - 400 K/uL   Neutrophils Relative % 73 43 - 77 %   Neutro Abs 9.1 (H) 1.7 - 7.7 K/uL   Lymphocytes Relative 18 12 - 46 %   Lymphs Abs 2.2 0.7 - 4.0 K/uL   Monocytes Relative 7 3 - 12 %   Monocytes Absolute 0.9 0.1 - 1.0 K/uL   Eosinophils Relative 2 0 -  5 %   Eosinophils Absolute 0.2 0.0 - 0.7 K/uL   Basophils Relative 0 0 - 1 %   Basophils Absolute 0.0 0.0 - 0.1 K/uL  Comprehensive metabolic panel  Result Value Ref Range   Sodium 124 (L) 135 - 145 mmol/L   Potassium 4.8 3.5 - 5.1 mmol/L   Chloride 84 (L) 96 - 112 mmol/L   CO2 29 19 - 32 mmol/L   Glucose, Bld 575 (HH) 70 - 99 mg/dL   BUN 32 (H) 6 - 23 mg/dL   Creatinine, Ser 1.32 (H) 0.50 - 1.10 mg/dL   Calcium 9.2 8.4 - 10.5 mg/dL   Total Protein 8.1 6.0 - 8.3 g/dL   Albumin 3.8 3.5 - 5.2 g/dL   AST 24 0 - 37 U/L   ALT 19 0 - 35 U/L   Alkaline Phosphatase 109 39 - 117 U/L   Total Bilirubin 0.9 0.3 - 1.2 mg/dL   GFR calc non Af Amer 37 (L) >90 mL/min   GFR calc Af Amer 43 (L) >90 mL/min   Anion gap 11 5 - 15  Urinalysis, Routine w reflex microscopic  Result Value Ref Range   Color, Urine YELLOW YELLOW   APPearance CLOUDY (A) CLEAR   Specific Gravity, Urine 1.026 1.005 - 1.030   pH 7.0 5.0 - 8.0   Glucose, UA >1000 (A) NEGATIVE mg/dL   Hgb urine dipstick NEGATIVE NEGATIVE   Bilirubin Urine NEGATIVE NEGATIVE   Ketones, ur NEGATIVE NEGATIVE mg/dL   Protein, ur 30 (A) NEGATIVE mg/dL   Urobilinogen, UA 1.0 0.0 - 1.0 mg/dL   Nitrite NEGATIVE NEGATIVE   Leukocytes, UA NEGATIVE NEGATIVE  Troponin I  Result Value Ref Range   Troponin I <0.03 <0.031 ng/mL  Urine microscopic-add on  Result Value Ref Range   Squamous Epithelial / LPF RARE  RARE   WBC, UA 0-2 <3 WBC/hpf   RBC / HPF 0-2 <3 RBC/hpf   Bacteria, UA MANY (A) RARE  I-Stat CG4 Lactic Acid, ED (not at [facility])  Result Value Ref Range   Lactic Acid, Venous 2.25 (HH) 0.5 - 2.0 mmol/L   Comment NOTIFIED PHYSICIAN   POC CBG, ED  Result Value Ref Range   Glucose-Capillary >600 (HH) 70 - 99 mg/dL   Comment 1 Notify RN   CBG monitoring, ED  Result Value Ref Range   Glucose-Capillary 546 (H) 70 - 99 mg/dL   Comment 1 Notify RN   I-Stat venous blood gas, ED  Result Value Ref Range   pH, Ven 7.319 (H) 7.250 - 7.300   pCO2, Ven 54.6 (H) 45.0 - 50.0 mmHg   pO2, Ven 15.0 (LL) 30.0 - 45.0 mmHg   Bicarbonate 28.0 (H) 20.0 - 24.0 mEq/L   TCO2 30 0 - 100 mmol/L   O2 Saturation 17.0 %   Acid-Base Excess 1.0 0.0 - 2.0 mmol/L   Patient temperature 98.9 F    Collection site IV START    Drawn by Operator    Sample type VENOUS    Comment NOTIFIED PHYSICIAN   CBG monitoring, ED  Result Value Ref Range   Glucose-Capillary 478 (H) 70 - 99 mg/dL     Imaging Review Dg Chest 2 View  08/25/2014   CLINICAL DATA:  Weakness 3 days.  EXAM: CHEST  2 VIEW  COMPARISON:  None.  FINDINGS: Elevated right hemidiaphragm with mild atelectasis right middle lobe. Negative for pneumonia. Negative for heart failure or mass lesion.  IMPRESSION: No active cardiopulmonary disease.  Electronically Signed   By: Franchot Gallo M.D.   On: 08/25/2014 12:14   Ct Head Wo Contrast  08/25/2014   CLINICAL DATA:  Weakness for 3 days, frequent urination  EXAM: CT HEAD WITHOUT CONTRAST  TECHNIQUE: Contiguous axial images were obtained from the base of the skull through the vertex without intravenous contrast.  COMPARISON:  None.  FINDINGS: No hemorrhage or extra-axial fluid. No hydrocephalus. Moderately severe atrophy and low attenuation diffusely in the deep white matter consistent with chronic small vessel ischemic change. Visualized portions of the paranasal sinuses clear. Calvarium intact.  IMPRESSION: Age-related  involutional change with no acute findings   Electronically Signed   By: Skipper Cliche M.D.   On: 08/25/2014 13:35     EKG Interpretation   Date/Time:  Friday August 25 2014 11:18:50 EDT Ventricular Rate:  93 PR Interval:  158 QRS Duration: 92 QT Interval:  364 QTC Calculation: 452 R Axis:   -12 Text Interpretation:  Normal sinus rhythm Moderate voltage criteria for  LVH, may be normal variant Anteroseptal infarct , age undetermined  Abnormal ECG No previous ECGs available Confirmed by Mcarthur Ivins  MD, Ludene Stokke  [phone]) on 08/25/2014 11:26:03 AM     CRITICAL CARE Performed by: Fredia Sorrow Total critical care time: 30 Critical care time was exclusive of separately billable procedures and treating other patients. Critical care was necessary to treat or prevent imminent or life-threatening deterioration. Critical care was time spent personally by me on the following activities: development of treatment plan with patient and/or surrogate as well as nursing, discussions with consultants, evaluation of patient's response to treatment, examination of patient, obtaining history from patient or surrogate, ordering and performing treatments and interventions, ordering and review of laboratory studies, ordering and review of radiographic studies, pulse oximetry and re-evaluation of patient's condition.     MDM   Final diagnoses:  Weakness  Hyperglycemia    Patient with marked hyperglycemia without evidence of acidosis. Patient initially the concern was for possible sepsis. Lactic acid slightly elevated. Patient started on septic protocol. Patient also started on glucose stabilizer for IV insulin and fluid to bring the blood sugar down. Head CT negative chest x-ray negative for pneumonia. Urinalysis negative. No specific infectious source currently. Patient normally followed at [facility] however they are on divert not accepting any transfers. Will contact the [facility]  for admission. Patient's blood sugar has improved from above 600 now down to the upper 400s. Never was any hyperkalemia no evidence of ketoacidosis, and no hypotension.    Fredia Sorrow, MD 08/25/14 1359

## 2014-08-25 NOTE — ED Notes (Signed)
CBG obtained read HI RN Marva notified

## 2014-08-25 NOTE — ED Notes (Signed)
Called Natasha Sexton--to let her know that her mother has been transported to [facility] room 2C06 by [facility].

## 2014-08-25 NOTE — ED Notes (Signed)
[facility] has been notified of room 2C06 at [facility]

## 2014-08-25 NOTE — ED Notes (Signed)
Just before leaving  Pt voided 1000cc clear urine.  cbg 141 and insulin drip changed to 2.6,also D5 .45%nss hung at 75cc/hr.  Alert and oriented.

## 2014-08-25 NOTE — Progress Notes (Signed)
ANTIBIOTIC CONSULT NOTE - INITIAL  Pharmacy Consult for Vancomycin, Zosyn  Indication: Sepsis   No Known Allergies  Patient Measurements: Height: 5\' 4"  (162.6 cm) Weight: 202 lb (91.627 kg) IBW/kg (Calculated) : 54.7  Vital Signs: Temp: 98.9 F (37.2 C) (04/22 1034) BP: 130/63 mmHg (04/22 1034) Pulse Rate: 89 (04/22 1034) Intake/Output from previous day:   Intake/Output from this shift:    Labs:  Recent Labs  08/25/14 1120  WBC 12.4*  HGB 13.8  PLT 190  CREATININE 1.32*   Estimated Creatinine Clearance: 37.9 mL/min (by C-G formula based on Cr of 1.32). No results for input(s): VANCOTROUGH, VANCOPEAK, VANCORANDOM, GENTTROUGH, GENTPEAK, GENTRANDOM, TOBRATROUGH, TOBRAPEAK, TOBRARND, AMIKACINPEAK, AMIKACINTROU, AMIKACIN in the last 72 hours.   Microbiology: No results found for this or any previous visit (from the past 720 hour(s)).  Medical History: Past Medical History  Diagnosis Date  . Diabetes mellitus without complication   . Hypertension   . Coronary artery disease   . Arthritis     Medications:   (Not in a hospital admission) Assessment: 34 YOF who presented with weakness. In the ED, the patient was found to have an elevated WBC and with marked hyperglycemia. LA was slightly elevated LA and was started on sepsis protocol. Pharmacy consulted to start Vancomycin and Zosyn for empiric coverage. CrCl ~ 35-40 mL/min  Cultures: 4/22 Urine Cx>> 4/22 BlooD Cx>>  Goal of Therapy:  Vancomycin trough level 15-20 mcg/ml  Plan:  -Vancomycin 1250 mg IV Q 24 hours -Zosyn 3.375 gm IV Q 8 hours -Monitor CBC, renal fx, cultures and clinical progress -VT at [place], PharmD., BCPS Clinical Pharmacist Pager [phone]

## 2014-08-26 ENCOUNTER — Inpatient Hospital Stay (HOSPITAL_COMMUNITY): Payer: Medicare PPO

## 2014-08-26 DIAGNOSIS — I9589 Other hypotension: Secondary | ICD-10-CM

## 2014-08-26 DIAGNOSIS — G9340 Encephalopathy, unspecified: Secondary | ICD-10-CM

## 2014-08-26 LAB — COMPREHENSIVE METABOLIC PANEL
ALK PHOS: 74 U/L (ref 39–117)
ALT: 16 U/L (ref 0–35)
AST: 22 U/L (ref 0–37)
Albumin: 2.6 g/dL — ABNORMAL LOW (ref 3.5–5.2)
Anion gap: 10 (ref 5–15)
BILIRUBIN TOTAL: 0.7 mg/dL (ref 0.3–1.2)
BUN: 22 mg/dL (ref 6–23)
CO2: 24 mmol/L (ref 19–32)
Calcium: 8.4 mg/dL (ref 8.4–10.5)
Chloride: 98 mmol/L (ref 96–112)
Creatinine, Ser: 1.16 mg/dL — ABNORMAL HIGH (ref 0.50–1.10)
GFR calc non Af Amer: 44 mL/min — ABNORMAL LOW (ref >90)
GFR, EST AFRICAN AMERICAN: 51 mL/min — AB (ref >90)
GLUCOSE: 314 mg/dL — AB (ref 70–99)
POTASSIUM: 4 mmol/L (ref 3.5–5.1)
Sodium: 132 mmol/L — ABNORMAL LOW (ref 135–145)
TOTAL PROTEIN: 6 g/dL (ref 6.0–8.3)

## 2014-08-26 LAB — TROPONIN I: Troponin I: <0.03 ng/mL (ref <0.031)

## 2014-08-26 LAB — GLUCOSE, CAPILLARY
GLUCOSE-CAPILLARY: 291 mg/dL — AB (ref 70–99)
GLUCOSE-CAPILLARY: 171 mg/dL — AB (ref 70–99)
Glucose-Capillary: 361 mg/dL — ABNORMAL HIGH (ref 70–99)
Glucose-Capillary: 350 mg/dL — ABNORMAL HIGH (ref 70–99)
Glucose-Capillary: 347 mg/dL — ABNORMAL HIGH (ref 70–99)
Glucose-Capillary: 285 mg/dL — ABNORMAL HIGH (ref 70–99)
Glucose-Capillary: 250 mg/dL — ABNORMAL HIGH (ref 70–99)

## 2014-08-26 LAB — CBC
HCT: 32.6 % — ABNORMAL LOW (ref 36.0–46.0)
HEMOGLOBIN: 11.1 g/dL — AB (ref 12.0–15.0)
MCH: 30.6 pg (ref 26.0–34.0)
MCHC: 34 g/dL (ref 30.0–36.0)
MCV: 89.8 fL (ref 78.0–100.0)
Platelets: 183 10*3/uL (ref 150–400)
RBC: 3.63 MIL/uL — ABNORMAL LOW (ref 3.87–5.11)
RDW: 12.7 % (ref 11.5–15.5)
WBC: 10.8 10*3/uL — AB (ref 4.0–10.5)

## 2014-08-26 MED ORDER — TECHNETIUM TO 99M ALBUMIN AGGREGATED
6.0000 | Freq: Once | INTRAVENOUS | Status: AC | PRN
  Administered 2014-08-26: 6 via INTRAVENOUS

## 2014-08-26 MED ORDER — INSULIN ASPART 100 UNIT/ML ~~LOC~~ SOLN: 4.0000 [IU] | Freq: Three times a day (TID) | SUBCUTANEOUS | Status: DC

## 2014-08-26 MED ORDER — INSULIN GLARGINE 100 UNIT/ML ~~LOC~~ SOLN
80.0000 [IU] | Freq: Every day | SUBCUTANEOUS | Status: DC
  Administered 2014-08-26: 80 [IU] via SUBCUTANEOUS
  Filled 2014-08-26: qty 0.8

## 2014-08-26 MED ORDER — INSULIN ASPART 100 UNIT/ML ~~LOC~~ SOLN: 10.0000 [IU] | Freq: Three times a day (TID) | SUBCUTANEOUS | Status: DC

## 2014-08-26 MED ORDER — INSULIN GLARGINE 100 UNIT/ML ~~LOC~~ SOLN
70.0000 [IU] | Freq: Every day | SUBCUTANEOUS | Status: DC
  Filled 2014-08-26: qty 0.7

## 2014-08-26 MED ORDER — TECHNETIUM TC 99M DIETHYLENETRIAME-PENTAACETIC ACID: 40.0000 | Freq: Once | INTRAVENOUS | Status: AC | PRN

## 2014-08-26 MED ORDER — INSULIN ASPART 100 UNIT/ML ~~LOC~~ SOLN
12.0000 [IU] | Freq: Once | SUBCUTANEOUS | Status: AC
  Administered 2014-08-26: 12 [IU] via SUBCUTANEOUS

## 2014-08-26 MED ORDER — INSULIN GLARGINE 100 UNIT/ML ~~LOC~~ SOLN
60.0000 [IU] | Freq: Every day | SUBCUTANEOUS | Status: DC
  Filled 2014-08-26: qty 0.6

## 2014-08-26 MED ORDER — INSULIN ASPART 100 UNIT/ML ~~LOC~~ SOLN
6.0000 [IU] | Freq: Three times a day (TID) | SUBCUTANEOUS | Status: DC
  Administered 2014-08-26: 6 [IU] via SUBCUTANEOUS

## 2014-08-26 MED ORDER — ENOXAPARIN SODIUM 40 MG/0.4ML ~~LOC~~ SOLN
40.0000 mg | SUBCUTANEOUS | Status: DC
  Administered 2014-08-26: 40 mg via SUBCUTANEOUS
  Filled 2014-08-26 (×2): qty 0.4

## 2014-08-26 NOTE — Progress Notes (Signed)
Pt being transferred to 6N29. Report given to Irena, [place]

## 2014-08-26 NOTE — Progress Notes (Signed)
Received pt to 6N29, oriented to room.

## 2014-08-26 NOTE — Progress Notes (Signed)
Per Dr. Maudie Mercury, insulin pump D/C. No other orders given related to transition protocal.   Alessandra Grout, RN

## 2014-08-26 NOTE — Progress Notes (Signed)
[facility] PROGRESS NOTE  Natasha Sexton JSH:[id number] DOB: 12/11/1932 DOA: 08/25/2014 PCP: [name], DO   brief narrative 81 year old female with type 2 DM, CAD, HTN,  who was sent to [facility] by her caregiver as patient appeared confused and caregiver had concern that patient was not eating properly or taking her diabetic medication. In the emergency room patient was lethargic and appeared fatigued. Vitals were stable in the ED but patient had drop in o2 sat. Blood will done showed mild leukocytosis. She was found to have nonketotic hyperglycemia with blood glucose greater than 600. Lactic acid was mildly elevated and patient started on sepsis protocol and placed on glucose stabilizer. Patient transferred to [facility] stepdown.  Assessment/Plan: Uncontrolled type 2 diabetes mellitus with nonketotic hyperglycemia Improved with glucose stabilizer. Fsg still elevated. Pt on lantus 49 u bid and premeal novolog 3 u tid.  i will place her on bedtime Lantus 80 units daily and aspart 10 units 3 times a day with meals. -Continue neurontin -Check A1c. -Diabetic coordinator consult.   Coronary artery disease Continue metoprolol, losartan and statin.  Acute encephalopathy Related to hyperglycemia and dehydration. Symptoms improving.  Dyspnea on admission mildly elevated d-dimer. VQ scan low probability for PE  Essential hypertension BP low normal.  continue IV fluids continue metoprolol. Hold losartan and HCTZ   AKI Hold losartan and HCTZ   Code Status: full code Family Communication: none at bedside Disposition Plan: tx to med surg   Consultants:  none  Procedures:  none  Antibiotics:  none  HPI/Subjective: Patient seen and examined. Reports feeling  tired but appears more oriented  Objective: Filed Vitals:   08/26/14 1200  BP: 110/44  Pulse: 70  Temp:   Resp: 13    Intake/Output Summary (Last 24 hours) at 08/26/14 1435 Last  data filed at 08/26/14 0900  Gross per 24 hour  Intake 2354.59 ml  Output    325 ml  Net 2029.59 ml   Filed Weights   08/25/14 1034 08/25/14 1846 08/26/14 0500  Weight: 91.627 kg (202 lb) 83.553 kg (184 lb 3.2 oz) 84 kg (185 lb 3 oz)    Exam:   General: Elderly female lying in bed, appears tired, not in distress  HEENT: No pallor, dry oral mucosa, supple neck  Chest: Clear to loss condition bilaterally, no added sounds  CVS: Normal S1 and S2, no murmurs rub or gallop  GI: Soft, nondistended, nontender, bowel sounds present  Musculoskeletal: Warm, no edema  CNS: Alert and oriented   Data Reviewed: Basic Metabolic Panel:  Recent Labs Lab 08/25/14 1120 08/26/14 0314  NA 124* 132*  K 4.8 4.0  CL 84* 98  CO2 29 24  GLUCOSE 575* 314*  BUN 32* 22  CREATININE 1.32* 1.16*  CALCIUM 9.2 8.4   Liver Function Tests:  Recent Labs Lab 08/25/14 1120 08/26/14 0314  AST 24 22  ALT 19 16  ALKPHOS 109 74  BILITOT 0.9 0.7  PROT 8.1 6.0  ALBUMIN 3.8 2.6*   No results for input(s): LIPASE, AMYLASE in the last 168 hours. No results for input(s): AMMONIA in the last 168 hours. CBC:  Recent Labs Lab 08/25/14 1120 08/26/14 0314  WBC 12.4* 10.8*  NEUTROABS 9.1*  --   HGB 13.8 11.1*  HCT 40.3 32.6*  MCV 91.8 89.8  PLT 190 183   Cardiac Enzymes:  Recent Labs Lab 08/25/14 1120 08/25/14 2101 08/26/14 0314  CKTOTAL  --  40  --   CKMB  --  2.6  --   TROPONINI <0.03 <0.03 <0.03   BNP (last 3 results) No results for input(s): BNP in the last 8760 hours.  ProBNP (last 3 results) No results for input(s): PROBNP in the last 8760 hours.  CBG:  Recent Labs Lab 08/25/14 2143 08/26/14 0619 08/26/14 0852 08/26/14 1248 08/26/14 1411  GLUCAP 209* 291* 361* 350* 347*    Recent Results (from the past 240 hour(s))  Blood Culture (routine x 2)     Status: None (Preliminary result)   Collection Time: 08/25/14 11:20 AM  Result Value Ref Range Status   Specimen  Description BLOOD LEFT WRIST  Final   Special Requests BOTTLES DRAWN AEROBIC AND ANAEROBIC 5CC EACH  Final   Culture   Final           BLOOD CULTURE RECEIVED NO GROWTH TO DATE CULTURE WILL BE HELD FOR 5 DAYS BEFORE ISSUING A FINAL NEGATIVE REPORT Performed at [facility]    Report Status PENDING  Incomplete  Blood Culture (routine x 2)     Status: None (Preliminary result)   Collection Time: 08/25/14 12:05 PM  Result Value Ref Range Status   Specimen Description BLOOD RIGHT HAND  Final   Special Requests BOTTLES DRAWN AEROBIC AND ANAEROBIC 5CC EACH  Final   Culture   Final           BLOOD CULTURE RECEIVED NO GROWTH TO DATE CULTURE WILL BE HELD FOR 5 DAYS BEFORE ISSUING A FINAL NEGATIVE REPORT Performed at [facility]    Report Status PENDING  Incomplete  MRSA PCR Screening     Status: None   Collection Time: 08/25/14  7:01 PM  Result Value Ref Range Status   MRSA by PCR NEGATIVE NEGATIVE Final    Comment:        The GeneXpert MRSA Assay (FDA approved for NASAL specimens only), is one component of a comprehensive MRSA colonization surveillance program. It is not intended to diagnose MRSA infection nor to guide or monitor treatment for MRSA infections.      Studies: Dg Chest 2 View  08/25/2014   CLINICAL DATA:  Weakness 3 days.  EXAM: CHEST  2 VIEW  COMPARISON:  None.  FINDINGS: Elevated right hemidiaphragm with mild atelectasis right middle lobe. Negative for pneumonia. Negative for heart failure or mass lesion.  IMPRESSION: No active cardiopulmonary disease.   Electronically Signed   By: Franchot Gallo M.D.   On: 08/25/2014 12:14   Ct Head Wo Contrast  08/25/2014   CLINICAL DATA:  Weakness for 3 days, frequent urination  EXAM: CT HEAD WITHOUT CONTRAST  TECHNIQUE: Contiguous axial images were obtained from the base of the skull through the vertex without intravenous contrast.  COMPARISON:  None.  FINDINGS: No hemorrhage or extra-axial fluid. No hydrocephalus.  Moderately severe atrophy and low attenuation diffusely in the deep white matter consistent with chronic small vessel ischemic change. Visualized portions of the paranasal sinuses clear. Calvarium intact.  IMPRESSION: Age-related involutional change with no acute findings   Electronically Signed   By: Skipper Cliche M.D.   On: 08/25/2014 13:35   Nm Pulmonary Perf And Vent  08/26/2014   CLINICAL DATA:  Positive D-dimer  EXAM: NUCLEAR MEDICINE VENTILATION - PERFUSION LUNG SCAN  TECHNIQUE: Ventilation images were obtained in multiple projections using inhaled aerosol technetium 99 M DTPA. Perfusion images were obtained in multiple projections after intravenous injection of Tc-26m MAA.  RADIOPHARMACEUTICALS:  40 mCi Tc-59m DTPA aerosol and 6 mCi Tc-67m  MAA  COMPARISON:  Chest x-ray 08/25/2014  FINDINGS: Ventilation: Subsegmental ventilation defects in the lung bases bilaterally  Perfusion: No wedge shaped peripheral perfusion defects to suggest acute pulmonary embolism.  IMPRESSION: Low probability for pulmonary emboli.   Electronically Signed   By: Franchot Gallo M.D.   On: 08/26/2014 13:55    Scheduled Meds: . enoxaparin (LOVENOX) injection  40 mg Subcutaneous Q24H  . gabapentin  600 mg Oral TID  . insulin aspart  0-15 Units Subcutaneous TID WC  . insulin aspart  0-5 Units Subcutaneous QHS  . insulin aspart  6 Units Subcutaneous TID WC  . insulin glargine  70 Units Subcutaneous QHS  . latanoprost  1 drop Both Eyes QHS  . losartan  100 mg Oral Daily  . metoprolol succinate  50 mg Oral Daily  . pravastatin  40 mg Oral q1800  . sodium chloride  3 mL Intravenous Q12H  . timolol  1 drop Left Eye Daily   Continuous Infusions:     Time spent: 25 minutes    Luree Palla  [facility] Pager [phone] 7PM-7AM, please contact night-coverage at [email or web address], password [facility] 08/26/2014, 2:35 PM  LOS: 1 day

## 2014-08-26 NOTE — Evaluation (Signed)
Physical Therapy Evaluation Patient Details Name: Natasha Sexton MRN: [id number] DOB: 04/02/1933 Today's Date: 08/26/2014   History of Present Illness  Patient is a 81 y/o female admitted with hyperglycemia with BG 575, ARF and weakness. PMH of DM, CAD, HTN and glaucoma.    Clinical Impression  Patient presents with generalized weakness, baseline visual deficits, impaired safety awareness and balance deficits impacting mobility. Required Min A for balance during gait to prevent fall. Visual deficits may be partly contributing to balance deficits due to unfamiliar environment. Daughter reports pt will have 24/7 S at home. Pt would benefit from skilled PT to improve transfers, gait, balance and mobility so pt can maximize independence and minimize fall risk prior to return home.    Follow Up Recommendations Home health PT;Supervision/Assistance - 24 hour    Equipment Recommendations  None recommended by PT    Recommendations for Other Services OT consult     Precautions / Restrictions Precautions Precautions: Fall Precaution Comments: poor vision. Restrictions Weight Bearing Restrictions: No      Mobility  Bed Mobility Overal bed mobility: Needs Assistance Bed Mobility: Supine to Sit     Supine to sit: Supervision;HOB elevated     General bed mobility comments: Use of rails.  Transfers Overall transfer level: Needs assistance Equipment used: Rolling walker (2 wheeled) Transfers: Sit to/from Stand Sit to Stand: Min guard         General transfer comment: Multiple attempts to stand from EOB. Pt pulling up on RW to stand despite cues for hand placement. Increased time. Stood from EOB x3. Transferred to chair with uncontrolled descent. + dizziness upon standing - resolved quickly.  Ambulation/Gait Ambulation/Gait assistance: Min assist Ambulation Distance (Feet): 10 Feet (+ 10' +15') Assistive device: Rolling walker (2 wheeled) Gait Pattern/deviations:  Step-through pattern;Decreased stride length;Trunk flexed;Staggering left;Staggering right;Drifts right/left   Gait velocity interpretation: Below normal speed for age/gender General Gait Details: Pt with unsteady gait requiring max directional cues due to visual deficits however pt having hard time understanding verbal directions - needed assist navigating RW and for RW proximity. Staggering right/left.  Stairs            Wheelchair Mobility    Modified Rankin (Stroke Patients Only)       Balance Overall balance assessment: Needs assistance Sitting-balance support: Feet supported;No upper extremity supported Sitting balance-Leahy Scale: Good     Standing balance support: During functional activity Standing balance-Leahy Scale: Poor                               Pertinent Vitals/Pain Pain Assessment: No/denies pain    Home Living Family/patient expects to be discharged to:: Private residence Living Arrangements: Spouse/significant other Available Help at Discharge: Family;Personal care attendant;Available 24 hours/day Type of Home: House Home Access: Stairs to enter Entrance Stairs-Rails: Right Entrance Stairs-Number of Steps: 3 Home Layout: One level Home Equipment: Walker - 4 wheels;Wheelchair - manual      Prior Function Level of Independence: Needs assistance   Gait / Transfers Assistance Needed: Reports no falls. Uses rollator PTA.  ADL's / Homemaking Assistance Needed: Has aide come in every day for 2 hours to assist pt and her spouse with ADLs and IADLs.  Comments: Pt's daughter present in room during evaluation and reports pt will be moving into apt in her complex soon as pt's husband moving into a [facility] home? Pt's daughter reports pt always has someone with her.  Hand Dominance        Extremity/Trunk Assessment   Upper Extremity Assessment: Defer to OT evaluation           Lower Extremity Assessment: Generalized weakness (Reports  sensation WFL BLEs.)         Communication   Communication: HOH  Cognition Arousal/Alertness: Awake/alert Behavior During Therapy: WFL for tasks assessed/performed Overall Cognitive Status: Impaired/Different from baseline Area of Impairment: Safety/judgement         Safety/Judgement: Decreased awareness of safety;Decreased awareness of deficits     General Comments: After being asked if pt should walk when feeling dizzy, "sure lets just walk." Explained dizziness putting pt at increased risk for falls.    General Comments General comments (skin integrity, edema, etc.): Pt's daughter present during session. Lengthy discussion with pt about need for rehab prior to return home. Pt seemed very willing and open to going to rehab facility however dgt prefering HHPT stating pt has 24/7 S.    Exercises        Assessment/Plan    PT Assessment Patient needs continued PT services  PT Diagnosis Difficulty walking;Generalized weakness   PT Problem List Decreased strength;Decreased activity tolerance;Decreased balance;Decreased mobility;Decreased safety awareness  PT Treatment Interventions Balance training;Gait training;Patient/family education;Functional mobility training;Therapeutic activities;Therapeutic exercise;Stair training;DME instruction   PT Goals (Current goals can be found in the Care Plan section) Acute Rehab PT Goals Patient Stated Goal: to get stronger PT Goal Formulation: With patient Time For Goal Achievement: 09/09/14 Potential to Achieve Goals: Good    Frequency Min 3X/week   Barriers to discharge   Per daughter, pt will have 24/7 S at home.    Co-evaluation               End of Session Equipment Utilized During Treatment: Gait belt Activity Tolerance: Patient limited by fatigue Patient left: in chair;with call bell/phone within reach;with family/visitor present (Instructed family to let RN know when they leave.) Nurse Communication: Mobility  status;Other (comment) (Placed heart monitor sticker on call bell so pt can find button due to visual deficits.)         Time: 9833-8250 PT Time Calculation (min) (ACUTE ONLY): 25 min   Charges:   PT Evaluation $Initial PT Evaluation Tier I: 1 Procedure PT Treatments $Gait Training: 8-22 mins   PT G CodesCandy Sledge A Sep 09, 2014, 3:59 PM  Candy Sledge, [place], DPT [phone]

## 2014-08-27 DIAGNOSIS — E860 Dehydration: Secondary | ICD-10-CM | POA: Diagnosis present

## 2014-08-27 DIAGNOSIS — N179 Acute kidney failure, unspecified: Secondary | ICD-10-CM | POA: Diagnosis present

## 2014-08-27 DIAGNOSIS — H409 Unspecified glaucoma: Secondary | ICD-10-CM | POA: Diagnosis present

## 2014-08-27 DIAGNOSIS — E11319 Type 2 diabetes mellitus with unspecified diabetic retinopathy without macular edema: Secondary | ICD-10-CM | POA: Diagnosis present

## 2014-08-27 DIAGNOSIS — IMO0002 Reserved for concepts with insufficient information to code with codable children: Secondary | ICD-10-CM | POA: Diagnosis present

## 2014-08-27 DIAGNOSIS — I10 Essential (primary) hypertension: Secondary | ICD-10-CM | POA: Diagnosis present

## 2014-08-27 DIAGNOSIS — H5440 Blindness, one eye, unspecified eye: Secondary | ICD-10-CM | POA: Diagnosis present

## 2014-08-27 DIAGNOSIS — E1165 Type 2 diabetes mellitus with hyperglycemia: Secondary | ICD-10-CM | POA: Diagnosis present

## 2014-08-27 DIAGNOSIS — G9340 Encephalopathy, unspecified: Secondary | ICD-10-CM | POA: Diagnosis present

## 2014-08-27 LAB — GLUCOSE, CAPILLARY
GLUCOSE-CAPILLARY: 169 mg/dL — AB (ref 70–99)
GLUCOSE-CAPILLARY: 109 mg/dL — AB (ref 70–99)

## 2014-08-27 MED ORDER — LANTUS SOLOSTAR 100 UNIT/ML ~~LOC~~ SOPN: 90.0000 [IU] | PEN_INJECTOR | Freq: Every day | SUBCUTANEOUS | Status: AC

## 2014-08-27 MED ORDER — LOSARTAN POTASSIUM 50 MG PO TABS: 100.0000 mg | ORAL_TABLET | Freq: Every day | ORAL | Status: AC

## 2014-08-27 MED ORDER — INSULIN GLARGINE 100 UNIT/ML ~~LOC~~ SOLN
90.0000 [IU] | Freq: Every day | SUBCUTANEOUS | Status: DC
  Filled 2014-08-27: qty 0.9

## 2014-08-27 MED ORDER — TRAMADOL HCL 50 MG PO TABS: 50.0000 mg | ORAL_TABLET | Freq: Three times a day (TID) | ORAL | Status: DC | PRN

## 2014-08-27 MED ORDER — INSULIN ASPART 100 UNIT/ML ~~LOC~~ SOLN: 12.0000 [IU] | Freq: Three times a day (TID) | SUBCUTANEOUS | Status: DC

## 2014-08-27 MED ORDER — INSULIN ASPART 100 UNIT/ML FLEXPEN: 12.0000 [IU] | PEN_INJECTOR | Freq: Three times a day (TID) | SUBCUTANEOUS | Status: AC

## 2014-08-27 NOTE — Discharge Summary (Addendum)
Physician Discharge Summary  Natasha Sexton GYJ:[id number] DOB: 10/06/1932 DOA: 08/25/2014  PCP: Natasha Nutting, DO  Admit date: 08/25/2014 Discharge date: 08/27/2014  Time spent: 35 minutes  Recommendations for Outpatient Follow-up:  1. Discharge home with home health PT and RN. 2. Follow-up with PCP in 1 week. Insulin dose have been adjusted and home health RN setup to assist with medication administration and monitor compliance and proper use of insulin. 3. Patient's blood pressure medications have been adjusted due to low blood pressure while in the hospital.  Discharge Diagnoses:  Principal Problem:   Hyperglycemia due to type 2 diabetes mellitus   Active Problems:   Uncontrolled type 2 diabetes with retinopathy   Hyperglycemia   Acute kidney injury   Glaucoma   Blind right eye   Dehydration   Acute encephalopathy   Hypotension.   Discharge Condition: Fair  Diet recommendation: Diabetic  Filed Weights   08/26/14 0500 08/26/14 1455 08/27/14 0620  Weight: 84 kg (185 lb 3 oz) 86.41 kg (190 lb 8 oz) 87.1 kg (192 lb 0.3 oz)    History of present illness:  Please refer to admission H&P for details, in brief, 81 year old female with type 2 DM with retinopathy, CAD, HTN, who was sent to [facility] by her caregiver as patient appeared confused and caregiver had concern that patient was not eating properly or taking her diabetic medication. In the emergency room patient was lethargic and appeared fatigued. Vitals were stable in the ED but patient had drop in o2 sat. Blood work done showed mild leukocytosis. She was found to have nonketotic hyperglycemia with blood glucose greater than 600. Lactic acid was mildly elevated and patient started on sepsis protocol and placed on glucose stabilizer. Patient transferred to [facility] stepdown.  Hospital Course:  Uncontrolled type 2 diabetes mellitus with nonketotic hyperglycemia, retinopathy and neuropathy Improved with  glucose stabilizer.  Pt at home on lantus 49 u bid and premeal novolog 4 u tid.  -I have changed her regimen to Lantus of 90 units at bedtime and pre-meal NovoLog all 12 units 3 times a day. -Continue neurontin -Check A1c. -Diabetic coordinator consult.   Coronary artery disease Continue metoprolol, losartan and statin. Reduced dose of losartan given hypotension.  Acute encephalopathy Related to hyperglycemia and dehydration. Symptoms improved to baseline. I have reduced her dose of tramadol 50 mg every 8 hours as needed only.  Dyspnea on admission mildly elevated d-dimer. VQ scan low probability for PE.  Hypotension BP low while in the hospital. Will discontinue HCTZ and reduced dose of losartan to 50 mg once daily.   Acute kidney injury Secondary to dehydration. Held losartan and HCTZ. Resolved with IV hydration. Resumed her losartan at low-dose.  Glaucoma Continue home eyedrops.  Patient seen by physical therapy and was noted to be unsteady. Recommend home health. Will arrange home health physical therapy and RN to assist with ambulation and monitoring her blood glucose and proper intake of her medications including insulin. Patient should follow-up with her PCP in 1 week..   Code Status: full code Family Communication: spoke with daughter Natasha Sexton on the phone  Disposition Plan: Home with home health.   Consultants:  none  Procedures:  none  Antibiotics:  none  Discharge Exam: Filed Vitals:   08/27/14 0620  BP: 103/48  Pulse: 65  Temp: 97.7 F (36.5 C)  Resp: 17    General: Elderly female lying in bed in no acute distress HEENT:  blind in right eye, pallor,  moist oral mucosa, neck supple Chest: Clear to auscultation bilaterally, no added sounds CVS: Normal S1 and S2, no murmurs rub or gallop GI: Soft, nondistended, nontender, bowel sounds present Musculoskeletal: Warm, no edema CNS: Alert and oriented  Discharge Instructions    Current Discharge  Medication List    CONTINUE these medications which have CHANGED   Details  insulin aspart (NOVOLOG) 100 UNIT/ML FlexPen Inject 12 Units into the skin 3 (three) times daily with meals. Qty: 15 mL, Refills: 3    LANTUS SOLOSTAR 100 UNIT/ML Solostar Pen Inject 90 Units into the skin at bedtime. Qty: 15 mL, Refills: 1    losartan (COZAAR) 50 MG tablet Take 2 tablets (100 mg total) by mouth daily. Qty: 30 tablet, Refills: 0    traMADol (ULTRAM) 50 MG tablet Take 1 tablet (50 mg total) by mouth 3 (three) times daily as needed for moderate pain (leg pains). Qty: 30 tablet, Refills: 0      CONTINUE these medications which have NOT CHANGED   Details  gabapentin (NEURONTIN) 600 MG tablet Take 600 mg by mouth 3 (three) times daily. Refills: 1    lovastatin (MEVACOR) 40 MG tablet Take 40 mg by mouth at bedtime. Refills: 1    metoprolol succinate (TOPROL-XL) 50 MG 24 hr tablet Take 50 mg by mouth daily. Take with or immediately following a meal.    omega-3 acid ethyl esters (LOVAZA) 1 G capsule Take 4 g by mouth daily.  Refills: 11    timolol (TIMOPTIC) 0.5 % ophthalmic solution Place 1 drop into the left eye every morning. Use the same time every morning. Refills: 5    Travoprost, BAK Free, (TRAVATAN) 0.004 % SOLN ophthalmic solution Place 1 drop into both eyes at bedtime.     !! B-D ULTRAFINE III SHORT PEN 31G X 8 MM MISC See admin instructions. Refills: 1    !! NOVOFINE PLUS 32G X 4 MM MISC Refills: 6        STOP taking these medications     losartan-hydrochlorothiazide (HYZAAR) 100-25 MG per tablet      insulin aspart (NOVOLOG) 100 UNIT/ML injection      insulin glargine (LANTUS) 100 UNIT/ML injection      latanoprost (XALATAN) 0.005 % ophthalmic solution        No Known Allergies Follow-up Information    Follow up with Natasha Nutting, DO. Schedule an appointment as soon as possible for a visit in 1 week.   Specialty:  Family Medicine   Contact information:   [address] [place] [address] [phone]        The results of significant diagnostics from this hospitalization (including imaging, microbiology, ancillary and laboratory) are listed below for reference.    Significant Diagnostic Studies: Dg Chest 2 View  08/25/2014   CLINICAL DATA:  Weakness 3 days.  EXAM: CHEST  2 VIEW  COMPARISON:  None.  FINDINGS: Elevated right hemidiaphragm with mild atelectasis right middle lobe. Negative for pneumonia. Negative for heart failure or mass lesion.  IMPRESSION: No active cardiopulmonary disease.   Electronically Signed   By: Franchot Gallo M.D.   On: 08/25/2014 12:14   Ct Head Wo Contrast  08/25/2014   CLINICAL DATA:  Weakness for 3 days, frequent urination  EXAM: CT HEAD WITHOUT CONTRAST  TECHNIQUE: Contiguous axial images were obtained from the base of the skull through the vertex without intravenous contrast.  COMPARISON:  None.  FINDINGS: No hemorrhage or extra-axial fluid. No hydrocephalus. Moderately  severe atrophy and low attenuation diffusely in the deep white matter consistent with chronic small vessel ischemic change. Visualized portions of the paranasal sinuses clear. Calvarium intact.  IMPRESSION: Age-related involutional change with no acute findings   Electronically Signed   By: Skipper Cliche M.D.   On: 08/25/2014 13:35   Nm Pulmonary Perf And Vent  08/26/2014   CLINICAL DATA:  Positive D-dimer  EXAM: NUCLEAR MEDICINE VENTILATION - PERFUSION LUNG SCAN  TECHNIQUE: Ventilation images were obtained in multiple projections using inhaled aerosol technetium 99 M DTPA. Perfusion images were obtained in multiple projections after intravenous injection of Tc-90m MAA.  RADIOPHARMACEUTICALS:  40 mCi Tc-2m DTPA aerosol and 6 mCi Tc-31m MAA  COMPARISON:  Chest x-ray 08/25/2014  FINDINGS: Ventilation: Subsegmental ventilation defects in the lung bases bilaterally  Perfusion: No wedge shaped peripheral perfusion defects to suggest acute pulmonary  embolism.  IMPRESSION: Low probability for pulmonary emboli.   Electronically Signed   By: Franchot Gallo M.D.   On: 08/26/2014 13:55    Microbiology: Recent Results (from the past 240 hour(s))  Blood Culture (routine x 2)     Status: None (Preliminary result)   Collection Time: 08/25/14 11:20 AM  Result Value Ref Range Status   Specimen Description BLOOD LEFT WRIST  Final   Special Requests BOTTLES DRAWN AEROBIC AND ANAEROBIC 5CC EACH  Final   Culture   Final           BLOOD CULTURE RECEIVED NO GROWTH TO DATE CULTURE WILL BE HELD FOR 5 DAYS BEFORE ISSUING A FINAL NEGATIVE REPORT Performed at [facility]    Report Status PENDING  Incomplete  Blood Culture (routine x 2)     Status: None (Preliminary result)   Collection Time: 08/25/14 12:05 PM  Result Value Ref Range Status   Specimen Description BLOOD RIGHT HAND  Final   Special Requests BOTTLES DRAWN AEROBIC AND ANAEROBIC 5CC EACH  Final   Culture   Final           BLOOD CULTURE RECEIVED NO GROWTH TO DATE CULTURE WILL BE HELD FOR 5 DAYS BEFORE ISSUING A FINAL NEGATIVE REPORT Performed at [facility]    Report Status PENDING  Incomplete  Urine culture     Status: None (Preliminary result)   Collection Time: 08/25/14 12:10 PM  Result Value Ref Range Status   Specimen Description URINE, CATHETERIZED  Final   Special Requests NONE  Final   Colony Count   Final    >=100,000 COLONIES/ML Performed at [facility]    Culture   Final    ESCHERICHIA COLI Performed at [facility]    Report Status PENDING  Incomplete  MRSA PCR Screening     Status: None   Collection Time: 08/25/14  7:01 PM  Result Value Ref Range Status   MRSA by PCR NEGATIVE NEGATIVE Final    Comment:        The GeneXpert MRSA Assay (FDA approved for NASAL specimens only), is one component of a comprehensive MRSA colonization surveillance program. It is not intended to diagnose MRSA infection nor to guide or monitor  treatment for MRSA infections.      Labs: Basic Metabolic Panel:  Recent Labs Lab 08/25/14 1120 08/26/14 0314  NA 124* 132*  K 4.8 4.0  CL 84* 98  CO2 29 24  GLUCOSE 575* 314*  BUN 32* 22  CREATININE 1.32* 1.16*  CALCIUM 9.2 8.4   Liver Function Tests:  Recent Labs Lab  08/25/14 1120 08/26/14 0314  AST 24 22  ALT 19 16  ALKPHOS 109 74  BILITOT 0.9 0.7  PROT 8.1 6.0  ALBUMIN 3.8 2.6*   No results for input(s): LIPASE, AMYLASE in the last 168 hours. No results for input(s): AMMONIA in the last 168 hours. CBC:  Recent Labs Lab 08/25/14 1120 08/26/14 0314  WBC 12.4* 10.8*  NEUTROABS 9.1*  --   HGB 13.8 11.1*  HCT 40.3 32.6*  MCV 91.8 89.8  PLT 190 183   Cardiac Enzymes:  Recent Labs Lab 08/25/14 1120 08/25/14 2101 08/26/14 0314  CKTOTAL  --  40  --   CKMB  --  2.6  --   TROPONINI <0.03 <0.03 <0.03   BNP: BNP (last 3 results) No results for input(s): BNP in the last 8760 hours.  ProBNP (last 3 results) No results for input(s): PROBNP in the last 8760 hours.  CBG:  Recent Labs Lab 08/26/14 1411 08/26/14 1503 08/26/14 1716 08/26/14 2200 08/27/14 0819  GLUCAP 347* 285* 171* 250* 169*       Signed:  Markeese Boyajian  [facility] 08/27/2014, 10:14 AM

## 2014-08-27 NOTE — Discharge Instructions (Signed)
High Blood Sugar °High blood sugar (hyperglycemia) means that the level of sugar in your blood is higher than it should be. Signs of high blood sugar include: °· Feeling thirsty. °· Frequent peeing (urinating). °· Feeling tired or sleepy. °· Dry mouth. °· Vision changes. °· Feeling weak. °· Feeling hungry but losing weight. °· Numbness and tingling in your hands or feet. °· Headache. °When you ignore these signs, your blood sugar may keep going up. These problems may get worse, and other problems may begin. °HOME CARE °· Check your blood sugars as told by your doctor. Write down the numbers with the date and time. °· Take the right amount of insulin or diabetes pills at the right time. Write down the dose with date and time. °· Refill your insulin or diabetes pills before running out. °· Watch what you eat. Follow your meal plan. °· Drink liquids without sugar, such as water. Check with your doctor if you have kidney or heart disease. °· Follow your doctor's orders for exercise. Exercise at the same time of day. °· Keep your doctor's appointments. °GET HELP RIGHT AWAY IF:  °· You have trouble thinking or are confused. °· You have fast breathing with fruity smelling breath. °· You pass out (faint). °· You have 2 to 3 days of high blood sugars and you do not know why. °· You have chest pain. °· You are feeling sick to your stomach (nauseous) or throwing up (vomiting). °· You have sudden vision changes. °MAKE SURE YOU:  °· Understand these instructions. °· Will watch your condition. °· Will get help right away if you are not doing well or get worse. °Document Released: 02/16/2009 Document Revised: 07/14/2011 Document Reviewed: 02/16/2009 °ExitCare® Patient Information ©2015 [employer]. This information is not intended to replace advice given to you by your health care provider. Make sure you discuss any questions you have with your health care provider. ° °

## 2014-08-27 NOTE — Progress Notes (Signed)
  DC home with daughter, verbally understood DC instructions

## 2014-08-27 NOTE — Care Management Note (Signed)
    Page 1 of 1   08/27/2014     10:54:50 AM CARE MANAGEMENT NOTE 08/27/2014  Patient:  Natasha Sexton, Natasha Sexton   Account Number:  [id number]  Date Initiated:  08/27/2014  Documentation initiated by:  [facility]  Subjective/Objective Assessment:   adm: weakness     Action/Plan:   discharge planning   Anticipated DC Date:  08/27/2014   Anticipated DC Plan:  [place]  CM consult      [facility] Choice  HOME HEALTH   Choice offered to / List presented to:  C-1 Patient        [place] arranged  [place] RN      [place].   Status of service:  Completed, signed off Medicare Important Message given?   (If response is "NO", the following Medicare IM given date fields will be blank) Date Medicare IM given:   Medicare IM given by:   Date Additional Medicare IM given:   Additional Medicare IM given by:    Discharge Disposition:  [place]  Per UR Regulation:    If discussed at Long Length of Stay Meetings, dates discussed:    Comments:  08/27/14 10:50 Cm spoke with pt to offer choice of home health agency.  Pt chooses [facility] to render HHPT/OT/RN. Address is changed to [place], [place] but home phone is the same as facesheet.  Referral called to [facility] rep, Miranda.  No other CM needs were communicated. Mariane Masters, BSN, CM [phone].

## 2014-08-28 LAB — HEMOGLOBIN A1C
Hgb A1c MFr Bld: 15.3 % — ABNORMAL HIGH (ref 4.8–5.6)
MEAN PLASMA GLUCOSE: 392 mg/dL

## 2014-08-28 LAB — URINE CULTURE: Colony Count: >=100000

## 2014-08-31 LAB — CULTURE, BLOOD (ROUTINE X 2)
CULTURE: NO GROWTH
Culture: NO GROWTH

## 2014-09-08 ENCOUNTER — Other Ambulatory Visit: Payer: Self-pay | Admitting: Internal Medicine

## 2014-09-30 ENCOUNTER — Other Ambulatory Visit: Payer: Self-pay | Admitting: Internal Medicine

## 2014-11-30 ENCOUNTER — Emergency Department (HOSPITAL_BASED_OUTPATIENT_CLINIC_OR_DEPARTMENT_OTHER): Payer: Medicare PPO

## 2014-11-30 ENCOUNTER — Emergency Department (HOSPITAL_BASED_OUTPATIENT_CLINIC_OR_DEPARTMENT_OTHER)
Admission: EM | Admit: 2014-11-30 | Discharge: 2014-11-30 | Disposition: A | Discharge status: Home / Self Care | Payer: Medicare PPO | Attending: Emergency Medicine | Admitting: Emergency Medicine

## 2014-11-30 ENCOUNTER — Encounter (HOSPITAL_BASED_OUTPATIENT_CLINIC_OR_DEPARTMENT_OTHER): Payer: Self-pay | Admitting: *Deleted

## 2014-11-30 DIAGNOSIS — Z794 Long term (current) use of insulin: Secondary | ICD-10-CM | POA: Insufficient documentation

## 2014-11-30 DIAGNOSIS — E119 Type 2 diabetes mellitus without complications: Secondary | ICD-10-CM | POA: Insufficient documentation

## 2014-11-30 DIAGNOSIS — R1032 Left lower quadrant pain: Secondary | ICD-10-CM | POA: Insufficient documentation

## 2014-11-30 DIAGNOSIS — I10 Essential (primary) hypertension: Secondary | ICD-10-CM | POA: Insufficient documentation

## 2014-11-30 DIAGNOSIS — R197 Diarrhea, unspecified: Secondary | ICD-10-CM | POA: Diagnosis not present

## 2014-11-30 DIAGNOSIS — H409 Unspecified glaucoma: Secondary | ICD-10-CM | POA: Diagnosis not present

## 2014-11-30 DIAGNOSIS — R1033 Periumbilical pain: Secondary | ICD-10-CM | POA: Diagnosis not present

## 2014-11-30 DIAGNOSIS — Z853 Personal history of malignant neoplasm of breast: Secondary | ICD-10-CM | POA: Diagnosis not present

## 2014-11-30 DIAGNOSIS — M7989 Other specified soft tissue disorders: Secondary | ICD-10-CM | POA: Insufficient documentation

## 2014-11-30 DIAGNOSIS — Z79899 Other long term (current) drug therapy: Secondary | ICD-10-CM | POA: Diagnosis not present

## 2014-11-30 DIAGNOSIS — I2510 Atherosclerotic heart disease of native coronary artery without angina pectoris: Secondary | ICD-10-CM | POA: Diagnosis not present

## 2014-11-30 HISTORY — DX: Malignant (primary) neoplasm, unspecified: C80.1

## 2014-11-30 LAB — BASIC METABOLIC PANEL
Anion gap: 6 (ref 5–15)
BUN: 32 mg/dL — AB (ref 6–20)
CO2: 24 mmol/L (ref 22–32)
CREATININE: 1.06 mg/dL — AB (ref 0.44–1.00)
Calcium: 9.2 mg/dL (ref 8.9–10.3)
Chloride: 105 mmol/L (ref 101–111)
GFR, EST AFRICAN AMERICAN: 56 mL/min — AB (ref >60)
GFR, EST NON AFRICAN AMERICAN: 49 mL/min — AB (ref >60)
GLUCOSE: 134 mg/dL — AB (ref 65–99)
POTASSIUM: 4.2 mmol/L (ref 3.5–5.1)
SODIUM: 135 mmol/L (ref 135–145)

## 2014-11-30 LAB — CBC WITH DIFFERENTIAL/PLATELET
Basophils Absolute: 0 10*3/uL (ref 0.0–0.1)
Basophils Relative: 0 % (ref 0–1)
Eosinophils Absolute: 0.2 10*3/uL (ref 0.0–0.7)
Eosinophils Relative: 3 % (ref 0–5)
HCT: 33.7 % — ABNORMAL LOW (ref 36.0–46.0)
HEMOGLOBIN: 11 g/dL — AB (ref 12.0–15.0)
Lymphocytes Relative: 32 % (ref 12–46)
Lymphs Abs: 2.4 10*3/uL (ref 0.7–4.0)
MCH: 31.4 pg (ref 26.0–34.0)
MCHC: 32.6 g/dL (ref 30.0–36.0)
MCV: 96.3 fL (ref 78.0–100.0)
MONOS PCT: 10 % (ref 3–12)
Monocytes Absolute: 0.8 10*3/uL (ref 0.1–1.0)
NEUTROS ABS: 4.1 10*3/uL (ref 1.7–7.7)
Neutrophils Relative %: 55 % (ref 43–77)
Platelets: 226 10*3/uL (ref 150–400)
RBC: 3.5 MIL/uL — ABNORMAL LOW (ref 3.87–5.11)
RDW: 12.4 % (ref 11.5–15.5)
WBC: 7.6 10*3/uL (ref 4.0–10.5)

## 2014-11-30 NOTE — ED Notes (Addendum)
Legs have been swelling x 2 days. The skin is coming off the inside of her mouth. Diarrhea this am. She has seen here MD about these symptoms and he does not know why she is having them.

## 2014-11-30 NOTE — ED Notes (Signed)
Per [name] with radiology, pt will be a 2 hour drinker. Pt will be scanned at 1530.

## 2014-11-30 NOTE — ED Provider Notes (Signed)
CSN: [id number]     Arrival date & time 11/30/14  1123 History   First MD Initiated Contact with Patient 11/30/14 1202     Chief Complaint  Patient presents with  . Leg Swelling     (Consider location/radiation/quality/duration/timing/severity/associated sxs/prior Treatment) HPI    PCP: Natasha Nutting, DO Blood pressure 166/91, pulse 66, temperature 98 F (36.7 C), temperature source Oral, resp. rate 18, height 5\' 4"  (1.626 m), weight 192 lb (87.091 kg), SpO2 99 %.  Natasha Sexton is a 81 y.o.female with a significant PMH of diabetes, glaucoma, cancer, arthritis, CAD, hypertension presents to the ER with complaints of diarrhea since last yesterday. She describes it as watery, non-bloody with associated periumbilical and LLQ abdominal pain. She is having lower extremity swelling which is chronic and sensation of desquamation in her mouth. She reports both of these are stable and have been going on for awhile, pt and daughter report that the PCP is working on these issues. She is here to be seen regarding diarrhea.  Daughter reports diarrhea has been and on again off again issue but some of the associated pain is new.  The patient denies diaphoresis, fever, headache, weakness (general or focal), confusion, change of vision,  neck pain, dysphagia, aphagia, chest pain, shortness of breath,  back pain, nausea, vomiting, rash.   Past Medical History  Diagnosis Date  . Diabetes mellitus without complication   . Hypertension   . Coronary artery disease   . Arthritis   . Glaucoma   . Cancer 2005    Breast   Past Surgical History  Procedure Laterality Date  . Cholecystectomy     Family History  Problem Relation Age of Onset  . Stroke Father   . Diabetes Mother    History  Substance Use Topics  . Smoking status: Never Smoker   . Smokeless tobacco: Not on file  . Alcohol Use: No   OB History    No data available     Review of Systems  10 Systems reviewed and are  negative for acute change except as noted in the HPI.    Allergies  Ivp dye  Home Medications   Prior to Admission medications   Medication Sig Start Date End Date Taking? Authorizing Provider  B-D ULTRAFINE III SHORT PEN 31G X 8 MM MISC See admin instructions. 06/01/14   Historical Provider, MD  gabapentin (NEURONTIN) 600 MG tablet Take 600 mg by mouth 3 (three) times daily. 06/01/14   Historical Provider, MD  insulin aspart (NOVOLOG) 100 UNIT/ML FlexPen Inject 12 Units into the skin 3 (three) times daily with meals. 08/27/14   Natasha Dhungel, MD  LANTUS SOLOSTAR 100 UNIT/ML Solostar Pen Inject 90 Units into the skin at bedtime. 08/27/14   Natasha Dhungel, MD  losartan (COZAAR) 50 MG tablet Take 2 tablets (100 mg total) by mouth daily. 08/27/14   Natasha Dhungel, MD  lovastatin (MEVACOR) 40 MG tablet Take 40 mg by mouth at bedtime. 06/05/14   Historical Provider, MD  metoprolol succinate (TOPROL-XL) 50 MG 24 hr tablet Take 50 mg by mouth daily. Take with or immediately following a meal.    Historical Provider, MD  NOVOFINE PLUS 32G X 4 MM MISC  06/01/14   Historical Provider, MD  omega-3 acid ethyl esters (LOVAZA) 1 G capsule Take 4 g by mouth daily.  06/01/14   Historical Provider, MD  timolol (TIMOPTIC) 0.5 % ophthalmic solution Place 1 drop into the left eye every morning. Use the same time  every morning. 07/12/14   Historical Provider, MD  traMADol (ULTRAM) 50 MG tablet Take 1 tablet (50 mg total) by mouth 3 (three) times daily as needed for moderate pain (leg pains). 08/27/14   Natasha Dhungel, MD  Travoprost, BAK Free, (TRAVATAN) 0.004 % SOLN ophthalmic solution Place 1 drop into both eyes at bedtime.     Historical Provider, MD   BP 166/91 mmHg  Pulse 66  Temp(Src) 98 F (36.7 C) (Oral)  Resp 18  Ht 5\' 4"  (1.626 m)  Wt 192 lb (87.091 kg)  BMI 32.94 kg/m2  SpO2 99% Physical Exam  Constitutional: She appears well-developed and well-nourished. No distress.  HENT:  Head: Normocephalic and  atraumatic.  No obvious signs of oral lesions or desquamation within the mouth. No bleeding or obvious signs of injury or abnormalities.  Eyes: Pupils are equal, round, and reactive to light.  Neck: Normal range of motion. Neck supple.  Cardiovascular: Normal rate and regular rhythm.   Pulmonary/Chest: Effort normal.  Abdominal: Soft. Bowel sounds are normal. She exhibits no distension. There is tenderness in the periumbilical area and left lower quadrant. There is guarding. There is no rigidity, no rebound, no CVA tenderness and negative Murphy's sign.  Musculoskeletal:  2+ bilateral pitting edema to bilateral lower extremities. Distal extremities are erythematous but not indurated. Pt describes swelling redness and chronic and unchanged.  Neurological: She is alert.  Skin: Skin is warm and dry.  Nursing note and vitals reviewed.   ED Course  Procedures (including critical care time) Labs Review Labs Reviewed  CBC WITH DIFFERENTIAL/PLATELET - Abnormal; Notable for the following:    RBC 3.50 (*)    Hemoglobin 11.0 (*)    HCT 33.7 (*)    All other components within normal limits  BASIC METABOLIC PANEL - Abnormal; Notable for the following:    Glucose, Bld 134 (*)    BUN 32 (*)    Creatinine, Ser 1.06 (*)    GFR calc non Af Amer 49 (*)    GFR calc Af Amer 56 (*)    All other components within normal limits    Imaging Review No results found.   EKG Interpretation None      MDM   Final diagnoses:  Swelling of lower extremity   Patients CBC and BMP are at baseline. Her chest xray show no signs of pulmonary edema or CHF. Her abdominal CT scan was negative for any acute pathology to cause concerning diarrhea.   The CT scan incidentally noted a small 6 mm pulmonary nodule in the. I took the patients daughter aside and discussed this with her. She requests that I do not tell the patient because she will be very concerned and obsessed with the nodule. The daughter will speak with  the patients PCP and decide if they want to repeat the scan in 6-12 months.  Otherwise, pt needs to f/u with PCP. Dr. Aline Sexton has seen patient and agrees that she is safe for DC at this time.  Medications - No data to display  81 y.o.Natasha Sexton's evaluation in the Emergency Department is complete. It has been determined that no acute conditions requiring further emergency intervention are present at this time. The patient/guardian have been advised of the diagnosis and plan. We have discussed signs and symptoms that warrant return to the ED, such as changes or worsening in symptoms.  Vital signs are stable at discharge. Filed Vitals:   11/30/14 1430  BP: 126/79  Pulse: 77  Temp:  98.3 F (36.8 C)  Resp: 18    Patient/guardian has voiced understanding and agreed to follow-up with the PCP or specialist.     Delos Haring, PA-C 11/30/14 1706  Pamella Pert, MD 12/01/14 8115

## 2014-11-30 NOTE — Discharge Instructions (Signed)

## 2014-11-30 NOTE — ED Provider Notes (Signed)
ED ECG REPORT   Date: 11/30/2014  Rate: 89  Rhythm: normal sinus rhythm  QRS Axis: normal  Intervals: normal  ST/T Wave abnormalities: normal  Conduction Disutrbances:none  Narrative Interpretation:   Old EKG Reviewed: no prior ekg available  EKG not avaiable in epic for interpretation into muse   Alfonzo Beers, MD 12/02/14 1238

## 2015-03-28 ENCOUNTER — Encounter (HOSPITAL_BASED_OUTPATIENT_CLINIC_OR_DEPARTMENT_OTHER): Payer: Self-pay | Admitting: *Deleted

## 2015-03-28 ENCOUNTER — Emergency Department (HOSPITAL_BASED_OUTPATIENT_CLINIC_OR_DEPARTMENT_OTHER)
Admission: EM | Admit: 2015-03-28 | Discharge: 2015-03-28 | Disposition: A | Discharge status: Home / Self Care | Payer: Medicare PPO | Attending: Physician Assistant | Admitting: Physician Assistant

## 2015-03-28 ENCOUNTER — Emergency Department (HOSPITAL_BASED_OUTPATIENT_CLINIC_OR_DEPARTMENT_OTHER): Payer: Medicare PPO

## 2015-03-28 DIAGNOSIS — Z794 Long term (current) use of insulin: Secondary | ICD-10-CM | POA: Diagnosis not present

## 2015-03-28 DIAGNOSIS — I2510 Atherosclerotic heart disease of native coronary artery without angina pectoris: Secondary | ICD-10-CM | POA: Diagnosis not present

## 2015-03-28 DIAGNOSIS — S3992XA Unspecified injury of lower back, initial encounter: Secondary | ICD-10-CM | POA: Diagnosis not present

## 2015-03-28 DIAGNOSIS — S29001A Unspecified injury of muscle and tendon of front wall of thorax, initial encounter: Secondary | ICD-10-CM | POA: Diagnosis not present

## 2015-03-28 DIAGNOSIS — S79911A Unspecified injury of right hip, initial encounter: Secondary | ICD-10-CM | POA: Diagnosis present

## 2015-03-28 DIAGNOSIS — Y9289 Other specified places as the place of occurrence of the external cause: Secondary | ICD-10-CM | POA: Diagnosis not present

## 2015-03-28 DIAGNOSIS — H409 Unspecified glaucoma: Secondary | ICD-10-CM | POA: Insufficient documentation

## 2015-03-28 DIAGNOSIS — Y9389 Activity, other specified: Secondary | ICD-10-CM | POA: Insufficient documentation

## 2015-03-28 DIAGNOSIS — Z79899 Other long term (current) drug therapy: Secondary | ICD-10-CM | POA: Insufficient documentation

## 2015-03-28 DIAGNOSIS — Y998 Other external cause status: Secondary | ICD-10-CM | POA: Insufficient documentation

## 2015-03-28 DIAGNOSIS — M1990 Unspecified osteoarthritis, unspecified site: Secondary | ICD-10-CM | POA: Insufficient documentation

## 2015-03-28 DIAGNOSIS — I10 Essential (primary) hypertension: Secondary | ICD-10-CM | POA: Diagnosis not present

## 2015-03-28 DIAGNOSIS — E119 Type 2 diabetes mellitus without complications: Secondary | ICD-10-CM | POA: Insufficient documentation

## 2015-03-28 DIAGNOSIS — S3991XA Unspecified injury of abdomen, initial encounter: Secondary | ICD-10-CM | POA: Diagnosis not present

## 2015-03-28 DIAGNOSIS — Z853 Personal history of malignant neoplasm of breast: Secondary | ICD-10-CM | POA: Diagnosis not present

## 2015-03-28 DIAGNOSIS — W010XXA Fall on same level from slipping, tripping and stumbling without subsequent striking against object, initial encounter: Secondary | ICD-10-CM | POA: Insufficient documentation

## 2015-03-28 DIAGNOSIS — W19XXXA Unspecified fall, initial encounter: Secondary | ICD-10-CM

## 2015-03-28 LAB — CBC WITH DIFFERENTIAL/PLATELET
BASOS ABS: 0 10*3/uL (ref 0.0–0.1)
BASOS PCT: 0 %
Eosinophils Absolute: 0.1 10*3/uL (ref 0.0–0.7)
Eosinophils Relative: 2 %
HCT: 29.7 % — ABNORMAL LOW (ref 36.0–46.0)
HEMOGLOBIN: 9.6 g/dL — AB (ref 12.0–15.0)
LYMPHS PCT: 27 %
Lymphs Abs: 2.1 10*3/uL (ref 0.7–4.0)
MCH: 30.8 pg (ref 26.0–34.0)
MCHC: 32.3 g/dL (ref 30.0–36.0)
MCV: 95.2 fL (ref 78.0–100.0)
MONOS PCT: 11 %
Monocytes Absolute: 0.8 10*3/uL (ref 0.1–1.0)
NEUTROS ABS: 4.8 10*3/uL (ref 1.7–7.7)
Neutrophils Relative %: 60 %
Platelets: 188 10*3/uL (ref 150–400)
RBC: 3.12 MIL/uL — ABNORMAL LOW (ref 3.87–5.11)
RDW: 12.8 % (ref 11.5–15.5)
WBC: 7.9 10*3/uL (ref 4.0–10.5)

## 2015-03-28 LAB — COMPREHENSIVE METABOLIC PANEL
ALT: 12 U/L — AB (ref 14–54)
ANION GAP: 6 (ref 5–15)
AST: 18 U/L (ref 15–41)
Albumin: 3.3 g/dL — ABNORMAL LOW (ref 3.5–5.0)
Alkaline Phosphatase: 86 U/L (ref 38–126)
BUN: 37 mg/dL — ABNORMAL HIGH (ref 6–20)
CALCIUM: 8.9 mg/dL (ref 8.9–10.3)
CO2: 24 mmol/L (ref 22–32)
Chloride: 104 mmol/L (ref 101–111)
Creatinine, Ser: 1.12 mg/dL — ABNORMAL HIGH (ref 0.44–1.00)
GFR calc Af Amer: 53 mL/min — ABNORMAL LOW (ref >60)
GFR calc non Af Amer: 45 mL/min — ABNORMAL LOW (ref >60)
GLUCOSE: 277 mg/dL — AB (ref 65–99)
Potassium: 4.8 mmol/L (ref 3.5–5.1)
SODIUM: 134 mmol/L — AB (ref 135–145)
Total Bilirubin: 0.7 mg/dL (ref 0.3–1.2)
Total Protein: 7 g/dL (ref 6.5–8.1)

## 2015-03-28 MED ORDER — ACETAMINOPHEN 325 MG PO TABS
650.0000 mg | ORAL_TABLET | Freq: Once | ORAL | Status: AC
  Administered 2015-03-28: 650 mg via ORAL
  Filled 2015-03-28: qty 2

## 2015-03-28 NOTE — ED Notes (Signed)
Pt caregiver has to leave, here are contact numbers for her and pt daughter:  Natasha Sexton, pt daughter: [phone] mobile, [phone] home. Chapman Fitch, CNA/caregiver [phone].

## 2015-03-28 NOTE — ED Notes (Signed)
Patient transported to X-ray and ct 

## 2015-03-28 NOTE — Discharge Instructions (Signed)
Please follow-up with your primary care physician as needed. We could not find any fractures associated with your fall. Please use ibuprofen or  over-the-counter Tylenol as needed.

## 2015-03-28 NOTE — ED Notes (Signed)
Pt reports losing her balance on Sunday, falling onto carpeted floor. Pt denies hitting head or loc "I landed straight on my butt!" bruising noted to buttocks, pt states "It doesn't hurt there, it hurts on my right side. Pt denies sob or pain increasing with inspiration. Assisted into gown and into bed.

## 2015-03-28 NOTE — ED Notes (Signed)
Patient stated she cannot give urine at this time.

## 2015-03-28 NOTE — ED Provider Notes (Signed)
CSN: [id number]     Arrival date & time 03/28/15  [id number] History   First MD Initiated Contact with Patient 03/28/15 1004     Chief Complaint  Patient presents with  . Fall     (Consider location/radiation/quality/duration/timing/severity/associated sxs/prior Treatment) HPI   Patient is a 81 year old female with history of diabetes hypertension CAD who walks with a walker presenting with fall on Sunday. Patient was in her normal state of health until she fell. Patient reports she's had pain in her right hip right flank and right chest wall. Patient has extensive bruising seen on her sacrum.  She reports eating and drinking normally. No vomiting.  Past Medical History  Diagnosis Date  . Diabetes mellitus without complication ([place])   . Hypertension   . Coronary artery disease   . Arthritis   . Glaucoma   . Cancer [facility]) 2005    Breast   Past Surgical History  Procedure Laterality Date  . Cholecystectomy     Family History  Problem Relation Age of Onset  . Stroke Father   . Diabetes Mother    Social History  Substance Use Topics  . Smoking status: Never Smoker   . Smokeless tobacco: None  . Alcohol Use: No   OB History    No data available     Review of Systems  Constitutional: Negative for fever, activity change and fatigue.  HENT: Negative for congestion.   Respiratory: Negative for shortness of breath.   Cardiovascular: Negative for chest pain.  Gastrointestinal: Negative for abdominal pain.  Genitourinary: Negative for dysuria.  Musculoskeletal: Positive for back pain and gait problem.  Neurological: Negative for dizziness and light-headedness.  Psychiatric/Behavioral: Negative for behavioral problems and agitation.      Allergies  Ivp dye  Home Medications   Prior to Admission medications   Medication Sig Start Date End Date Taking? Authorizing Provider  B-D ULTRAFINE III SHORT PEN 31G X 8 MM MISC See admin instructions. 06/01/14   Historical Provider,  MD  gabapentin (NEURONTIN) 600 MG tablet Take 600 mg by mouth 3 (three) times daily. 06/01/14   Historical Provider, MD  insulin aspart (NOVOLOG) 100 UNIT/ML FlexPen Inject 12 Units into the skin 3 (three) times daily with meals. 08/27/14   Nishant Dhungel, MD  LANTUS SOLOSTAR 100 UNIT/ML Solostar Pen Inject 90 Units into the skin at bedtime. 08/27/14   Nishant Dhungel, MD  losartan (COZAAR) 50 MG tablet Take 2 tablets (100 mg total) by mouth daily. 08/27/14   Nishant Dhungel, MD  lovastatin (MEVACOR) 40 MG tablet Take 40 mg by mouth at bedtime. 06/05/14   Historical Provider, MD  metoprolol succinate (TOPROL-XL) 50 MG 24 hr tablet Take 50 mg by mouth daily. Take with or immediately following a meal.    Historical Provider, MD  NOVOFINE PLUS 32G X 4 MM MISC  06/01/14   Historical Provider, MD  omega-3 acid ethyl esters (LOVAZA) 1 G capsule Take 4 g by mouth daily.  06/01/14   Historical Provider, MD  timolol (TIMOPTIC) 0.5 % ophthalmic solution Place 1 drop into the left eye every morning. Use the same time every morning. 07/12/14   Historical Provider, MD  traMADol (ULTRAM) 50 MG tablet Take 1 tablet (50 mg total) by mouth 3 (three) times daily as needed for moderate pain (leg pains). 08/27/14   Nishant Dhungel, MD  Travoprost, BAK Free, (TRAVATAN) 0.004 % SOLN ophthalmic solution Place 1 drop into both eyes at bedtime.     Historical Provider, MD  BP 137/64 mmHg  Pulse 69  Temp(Src) 98.1 F (36.7 C) (Oral)  Resp 18  Ht 5\' 5"  (1.651 m)  Wt 200 lb (90.719 kg)  BMI 33.28 kg/m2  SpO2 99% Physical Exam  Constitutional: She is oriented to person, place, and time. She appears well-developed and well-nourished.  Elderly female  HENT:  Head: Normocephalic and atraumatic.  Eyes: Conjunctivae are normal. Right eye exhibits no discharge.  Neck: Neck supple.  Cardiovascular: Normal rate, regular rhythm and normal heart sounds.   No murmur heard. Pulmonary/Chest: Effort normal and breath sounds normal. She  has no wheezes. She has no rales. She exhibits tenderness.  Mild tenderness to lower right ribs.  Abdominal: Soft. She exhibits no distension. There is tenderness.  Mild tenderness to right flank  Musculoskeletal:  Right hip pain. Pain with exterior rotation of the right hip.    Neurological: She is oriented to person, place, and time. No cranial nerve deficit.  Skin: Skin is warm and dry. No rash noted. She is not diaphoretic.  Extensive ecchymosis to sacrum.  Psychiatric: Her behavior is normal.  Nursing note and vitals reviewed.   ED Course  Procedures (including critical care time) Labs Review Labs Reviewed  URINE CULTURE  COMPREHENSIVE METABOLIC PANEL  CBC WITH DIFFERENTIAL/PLATELET  URINALYSIS, ROUTINE W REFLEX MICROSCOPIC (NOT AT [facility])    Imaging Review No results found. I have personally reviewed and evaluated these images and lab results as part of my medical decision-making.   EKG Interpretation None      MDM   Final diagnoses:  None    Patient is a 81 year old female presenting after mechanical fall on Sunday. Patient reports that she's been having pain in her right hip flank and right chest wall. Patient IV dye allergy. We will get CT noncontrast. Additionally, we will get plain films of the hip and pelvis and chest.  Xray and CT neagtive.  Pt given tylenol and ambulated steadily. Vital signs normal and patient appears very well. Daughter here and will take care of patient.  Pt will follow up with PCP.       Khaidyn Staebell Julio Alm, MD 03/29/15 [phone]

## 2015-03-28 NOTE — ED Notes (Signed)
Family at bedside. 

## 2015-03-28 NOTE — ED Notes (Signed)
Patient ambulates around RN station with walker without difficulty or assistance.

## 2015-06-19 ENCOUNTER — Encounter (HOSPITAL_BASED_OUTPATIENT_CLINIC_OR_DEPARTMENT_OTHER): Payer: Self-pay | Admitting: *Deleted

## 2015-06-19 ENCOUNTER — Emergency Department (HOSPITAL_BASED_OUTPATIENT_CLINIC_OR_DEPARTMENT_OTHER)
Admission: EM | Admit: 2015-06-19 | Discharge: 2015-06-20 | Disposition: A | Discharge status: Home / Self Care | Payer: Medicare PPO | Attending: Emergency Medicine | Admitting: Emergency Medicine

## 2015-06-19 DIAGNOSIS — Z853 Personal history of malignant neoplasm of breast: Secondary | ICD-10-CM | POA: Insufficient documentation

## 2015-06-19 DIAGNOSIS — W2209XA Striking against other stationary object, initial encounter: Secondary | ICD-10-CM | POA: Diagnosis not present

## 2015-06-19 DIAGNOSIS — R600 Localized edema: Secondary | ICD-10-CM | POA: Diagnosis not present

## 2015-06-19 DIAGNOSIS — S81812A Laceration without foreign body, left lower leg, initial encounter: Secondary | ICD-10-CM | POA: Insufficient documentation

## 2015-06-19 DIAGNOSIS — Y998 Other external cause status: Secondary | ICD-10-CM | POA: Insufficient documentation

## 2015-06-19 DIAGNOSIS — H409 Unspecified glaucoma: Secondary | ICD-10-CM | POA: Insufficient documentation

## 2015-06-19 DIAGNOSIS — Y9289 Other specified places as the place of occurrence of the external cause: Secondary | ICD-10-CM | POA: Insufficient documentation

## 2015-06-19 DIAGNOSIS — Z79899 Other long term (current) drug therapy: Secondary | ICD-10-CM | POA: Insufficient documentation

## 2015-06-19 DIAGNOSIS — Z794 Long term (current) use of insulin: Secondary | ICD-10-CM | POA: Insufficient documentation

## 2015-06-19 DIAGNOSIS — I10 Essential (primary) hypertension: Secondary | ICD-10-CM | POA: Insufficient documentation

## 2015-06-19 DIAGNOSIS — Y9389 Activity, other specified: Secondary | ICD-10-CM | POA: Diagnosis not present

## 2015-06-19 DIAGNOSIS — Z8739 Personal history of other diseases of the musculoskeletal system and connective tissue: Secondary | ICD-10-CM | POA: Insufficient documentation

## 2015-06-19 DIAGNOSIS — I2510 Atherosclerotic heart disease of native coronary artery without angina pectoris: Secondary | ICD-10-CM | POA: Diagnosis not present

## 2015-06-19 DIAGNOSIS — E119 Type 2 diabetes mellitus without complications: Secondary | ICD-10-CM | POA: Diagnosis not present

## 2015-06-19 DIAGNOSIS — S8992XA Unspecified injury of left lower leg, initial encounter: Secondary | ICD-10-CM | POA: Diagnosis present

## 2015-06-19 DIAGNOSIS — R609 Edema, unspecified: Secondary | ICD-10-CM

## 2015-06-19 NOTE — ED Provider Notes (Signed)
CSN: [id number]     Arrival date & time 06/19/15  2305 History   By signing my name below, I, Natasha Sexton, attest that this documentation has been prepared under the direction and in the presence of Shanon Rosser, MD.  Electronically Signed: Forrestine Sexton, ED Scribe. 06/19/2015. 11:45 PM.   Chief Complaint  Patient presents with  . Extremity Laceration   HPI  HPI Comments: Natasha Sexton brought in by EMS is a 82 y.o. female with a PMHx of DM, CAD, breast cancer, peripheral edema and neuropathy who presents to the Emergency Department here for a laceration to the L lower leg sustained just prior to arrival. Pt states she hit her leg against a bedding raling and reports a significant  amount of bleeding at time of incident. However, bleeding is controlled at this time after bandaging by EMS. Pt denies any pain at this time as her neuropathy has caused her lower legs to by nearly insensate. No recent fever or chills. Pt is currently on Augmentin for a UTI.  PCP: [name], DO    Past Medical History  Diagnosis Date  . Diabetes mellitus without complication ([place])   . Hypertension   . Coronary artery disease   . Arthritis   . Glaucoma   . Cancer [facility]) 2005    Breast   Past Surgical History  Procedure Laterality Date  . Cholecystectomy     Family History  Problem Relation Age of Onset  . Stroke Father   . Diabetes Mother    Social History  Substance Use Topics  . Smoking status: Never Smoker   . Smokeless tobacco: None  . Alcohol Use: No   OB History    No data available     Review of Systems  All other systems reviewed and are negative.   Allergies  Ivp dye  Home Medications   Prior to Admission medications   Medication Sig Start Date End Date Taking? Authorizing Provider  B-D ULTRAFINE III SHORT PEN 31G X 8 MM MISC See admin instructions. 06/01/14   Historical Provider, MD  gabapentin (NEURONTIN) 600 MG tablet Take 600 mg by mouth 3 (three) times daily.  06/01/14   Historical Provider, MD  insulin aspart (NOVOLOG) 100 UNIT/ML FlexPen Inject 12 Units into the skin 3 (three) times daily with meals. 08/27/14   Nishant Dhungel, MD  LANTUS SOLOSTAR 100 UNIT/ML Solostar Pen Inject 90 Units into the skin at bedtime. 08/27/14   Nishant Dhungel, MD  losartan (COZAAR) 50 MG tablet Take 2 tablets (100 mg total) by mouth daily. 08/27/14   Nishant Dhungel, MD  lovastatin (MEVACOR) 40 MG tablet Take 40 mg by mouth at bedtime. 06/05/14   Historical Provider, MD  metoprolol succinate (TOPROL-XL) 50 MG 24 hr tablet Take 50 mg by mouth daily. Take with or immediately following a meal.    Historical Provider, MD  NOVOFINE PLUS 32G X 4 MM MISC  06/01/14   Historical Provider, MD  omega-3 acid ethyl esters (LOVAZA) 1 G capsule Take 4 g by mouth daily.  06/01/14   Historical Provider, MD  timolol (TIMOPTIC) 0.5 % ophthalmic solution Place 1 drop into the left eye every morning. Use the same time every morning. 07/12/14   Historical Provider, MD  traMADol (ULTRAM) 50 MG tablet Take 1 tablet (50 mg total) by mouth 3 (three) times daily as needed for moderate pain (leg pains). 08/27/14   Nishant Dhungel, MD  Travoprost, BAK Free, (TRAVATAN) 0.004 % SOLN ophthalmic solution Place  1 drop into both eyes at bedtime.     Historical Provider, MD   Triage Vitals: BP 150/65 mmHg  Pulse 73  Temp(Src) 97.8 F (36.6 C) (Oral)  Resp 16  SpO2 98%   Physical Exam  General: Well-developed, well-nourished female in no acute distress; appearance consistent with age of record HENT: normocephalic; atraumatic Eyes: L pupil equal, round and reactive to light; blind R eye extraocular muscles intact Neck: supple Heart: regular rate and rhythm; no murmurs, rubs or gallops Lungs: clear to auscultation bilaterally Abdomen: soft; nondistended; nontender; no masses or hepatosplenomegaly; bowel sounds present Extremities: No deformity; full range of motion; pulses normal; 2+ pitting edema of lower  legs Neurologic: Awake, alert and oriented; motor function intact in all extremities and symmetric; no facial droop; lower legs nearly totally insensate  Skin: Warm and dry; large skin tear left lower leg as shown below Psychiatric: Normal mood and affect      ED Course  Procedures (including critical care time)  DIAGNOSTIC STUDIES: Oxygen Saturation is 98% on RA, Normal by my interpretation.    COORDINATION OF CARE: 11:19 PM- Suture closure (horizontal mattress) was attempted and unsuccessful due to fragility of skin.   11:34 PM- loose approximation obtained with Steri-Strips. Advised close follow up with PCP due to high risk of wound infection. Patient is currently on Augmentin so will not add additional antibiotic coverage. Discussed treatment plan with pt and daughter.   MDM   Final diagnoses:  Skin tear of lower leg without complication, left, initial encounter  Peripheral edema   I personally performed the services described in this documentation, which was scribed in my presence. The recorded information has been reviewed and is accurate.   Shanon Rosser, MD 06/19/15 2351

## 2015-06-19 NOTE — ED Notes (Signed)
Pt bumped up against bed railing and tore skin of left lower leg. Bleeding controlled

## 2015-08-14 ENCOUNTER — Encounter (HOSPITAL_BASED_OUTPATIENT_CLINIC_OR_DEPARTMENT_OTHER): Payer: Self-pay | Admitting: *Deleted

## 2015-08-14 ENCOUNTER — Emergency Department (HOSPITAL_BASED_OUTPATIENT_CLINIC_OR_DEPARTMENT_OTHER)
Admission: EM | Admit: 2015-08-14 | Discharge: 2015-08-14 | Disposition: A | Discharge status: Home / Self Care | Payer: Medicare PPO | Attending: Emergency Medicine | Admitting: Emergency Medicine

## 2015-08-14 ENCOUNTER — Emergency Department (HOSPITAL_BASED_OUTPATIENT_CLINIC_OR_DEPARTMENT_OTHER): Payer: Medicare PPO

## 2015-08-14 DIAGNOSIS — I2510 Atherosclerotic heart disease of native coronary artery without angina pectoris: Secondary | ICD-10-CM | POA: Diagnosis not present

## 2015-08-14 DIAGNOSIS — M1990 Unspecified osteoarthritis, unspecified site: Secondary | ICD-10-CM | POA: Diagnosis not present

## 2015-08-14 DIAGNOSIS — Z794 Long term (current) use of insulin: Secondary | ICD-10-CM | POA: Diagnosis not present

## 2015-08-14 DIAGNOSIS — E119 Type 2 diabetes mellitus without complications: Secondary | ICD-10-CM | POA: Diagnosis not present

## 2015-08-14 DIAGNOSIS — R05 Cough: Secondary | ICD-10-CM | POA: Diagnosis not present

## 2015-08-14 DIAGNOSIS — Z79899 Other long term (current) drug therapy: Secondary | ICD-10-CM | POA: Diagnosis not present

## 2015-08-14 DIAGNOSIS — H409 Unspecified glaucoma: Secondary | ICD-10-CM | POA: Insufficient documentation

## 2015-08-14 DIAGNOSIS — F419 Anxiety disorder, unspecified: Secondary | ICD-10-CM | POA: Insufficient documentation

## 2015-08-14 DIAGNOSIS — R449 Unspecified symptoms and signs involving general sensations and perceptions: Secondary | ICD-10-CM

## 2015-08-14 DIAGNOSIS — R059 Cough, unspecified: Secondary | ICD-10-CM

## 2015-08-14 DIAGNOSIS — R197 Diarrhea, unspecified: Secondary | ICD-10-CM | POA: Diagnosis not present

## 2015-08-14 DIAGNOSIS — K1329 Other disturbances of oral epithelium, including tongue: Secondary | ICD-10-CM | POA: Diagnosis not present

## 2015-08-14 DIAGNOSIS — Z853 Personal history of malignant neoplasm of breast: Secondary | ICD-10-CM | POA: Diagnosis not present

## 2015-08-14 DIAGNOSIS — F22 Delusional disorders: Secondary | ICD-10-CM | POA: Insufficient documentation

## 2015-08-14 DIAGNOSIS — I10 Essential (primary) hypertension: Secondary | ICD-10-CM | POA: Insufficient documentation

## 2015-08-14 DIAGNOSIS — N390 Urinary tract infection, site not specified: Secondary | ICD-10-CM | POA: Insufficient documentation

## 2015-08-14 LAB — CBC WITH DIFFERENTIAL/PLATELET
Basophils Absolute: 0 10*3/uL (ref 0.0–0.1)
Basophils Relative: 0 %
EOS ABS: 0.1 10*3/uL (ref 0.0–0.7)
EOS PCT: 2 %
HCT: 28.4 % — ABNORMAL LOW (ref 36.0–46.0)
Hemoglobin: 9.6 g/dL — ABNORMAL LOW (ref 12.0–15.0)
LYMPHS ABS: 2.2 10*3/uL (ref 0.7–4.0)
Lymphocytes Relative: 28 %
MCH: 31.3 pg (ref 26.0–34.0)
MCHC: 33.8 g/dL (ref 30.0–36.0)
MCV: 92.5 fL (ref 78.0–100.0)
MONO ABS: 0.7 10*3/uL (ref 0.1–1.0)
MONOS PCT: 9 %
NEUTROS PCT: 61 %
Neutro Abs: 5 10*3/uL (ref 1.7–7.7)
PLATELETS: 229 10*3/uL (ref 150–400)
RBC: 3.07 MIL/uL — AB (ref 3.87–5.11)
RDW: 12.7 % (ref 11.5–15.5)
WBC: 8.1 10*3/uL (ref 4.0–10.5)

## 2015-08-14 LAB — URINALYSIS, ROUTINE W REFLEX MICROSCOPIC
Bilirubin Urine: NEGATIVE
Ketones, ur: NEGATIVE mg/dL
Nitrite: NEGATIVE
PH: 5.5 (ref 5.0–8.0)
Specific Gravity, Urine: 1.02 (ref 1.005–1.030)

## 2015-08-14 LAB — COMPREHENSIVE METABOLIC PANEL
ALT: 25 U/L (ref 14–54)
ANION GAP: 7 (ref 5–15)
AST: 58 U/L — ABNORMAL HIGH (ref 15–41)
Albumin: 3.3 g/dL — ABNORMAL LOW (ref 3.5–5.0)
Alkaline Phosphatase: 87 U/L (ref 38–126)
BUN: 37 mg/dL — ABNORMAL HIGH (ref 6–20)
CHLORIDE: 99 mmol/L — AB (ref 101–111)
CO2: 24 mmol/L (ref 22–32)
CREATININE: 1.29 mg/dL — AB (ref 0.44–1.00)
Calcium: 8.8 mg/dL — ABNORMAL LOW (ref 8.9–10.3)
GFR calc Af Amer: 44 mL/min — ABNORMAL LOW (ref >60)
GFR, EST NON AFRICAN AMERICAN: 38 mL/min — AB (ref >60)
Glucose, Bld: 379 mg/dL — ABNORMAL HIGH (ref 65–99)
Potassium: 4.4 mmol/L (ref 3.5–5.1)
SODIUM: 130 mmol/L — AB (ref 135–145)
Total Bilirubin: 0.6 mg/dL (ref 0.3–1.2)
Total Protein: 7.3 g/dL (ref 6.5–8.1)

## 2015-08-14 LAB — URINE MICROSCOPIC-ADD ON

## 2015-08-14 LAB — CBG MONITORING, ED: GLUCOSE-CAPILLARY: 222 mg/dL — AB (ref 65–99)

## 2015-08-14 MED ORDER — SODIUM CHLORIDE 0.9 % IV BOLUS (SEPSIS)
1000.0000 mL | Freq: Once | INTRAVENOUS | Status: AC
  Administered 2015-08-14: 1000 mL via INTRAVENOUS

## 2015-08-14 MED ORDER — CEPHALEXIN 500 MG PO CAPS: 500.0000 mg | ORAL_CAPSULE | Freq: Two times a day (BID) | ORAL | Status: DC

## 2015-08-14 MED ORDER — INSULIN ASPART 100 UNIT/ML ~~LOC~~ SOLN
4.0000 [IU] | Freq: Once | SUBCUTANEOUS | Status: AC
  Administered 2015-08-14: 4 [IU] via SUBCUTANEOUS
  Filled 2015-08-14: qty 1

## 2015-08-14 NOTE — ED Notes (Signed)
Daughter states pt thinks she has tapeworms. Onset x several weeks.

## 2015-08-14 NOTE — ED Notes (Signed)
Pt placed on auto vitals Q30.  

## 2015-08-14 NOTE — ED Notes (Signed)
Family member has arrived to take patient home. D/C instructions reviewed with family member as well as patient.

## 2015-08-14 NOTE — Discharge Instructions (Signed)
Follow up with your PCP.    Cough, Adult Coughing is a reflex that clears your throat and your airways. Coughing helps to heal and protect your lungs. It is normal to cough occasionally, but a cough that happens with other symptoms or lasts a long time may be a sign of a condition that needs treatment. A cough may last only 2-3 weeks (acute), or it may last longer than 8 weeks (chronic). CAUSES Coughing is commonly caused by:  Breathing in substances that irritate your lungs.  A viral or bacterial respiratory infection.  Allergies.  Asthma.  Postnasal drip.  Smoking.  Acid backing up from the stomach into the esophagus (gastroesophageal reflux).  Certain medicines.  Chronic lung problems, including COPD (or rarely, lung cancer).  Other medical conditions such as heart failure. HOME CARE INSTRUCTIONS  Pay attention to any changes in your symptoms. Take these actions to help with your discomfort:  Take medicines only as told by your health care provider.  If you were prescribed an antibiotic medicine, take it as told by your health care provider. Do not stop taking the antibiotic even if you start to feel better.  Talk with your health care provider before you take a cough suppressant medicine.  Drink enough fluid to keep your urine clear or pale yellow.  If the air is dry, use a cold steam vaporizer or humidifier in your bedroom or your home to help loosen secretions.  Avoid anything that causes you to cough at work or at home.  If your cough is worse at night, try sleeping in a semi-upright position.  Avoid cigarette smoke. If you smoke, quit smoking. If you need help quitting, ask your health care provider.  Avoid caffeine.  Avoid alcohol.  Rest as needed. SEEK MEDICAL CARE IF:   You have new symptoms.  You cough up pus.  Your cough does not get better after 2-3 weeks, or your cough gets worse.  You cannot control your cough with suppressant medicines and  you are losing sleep.  You develop pain that is getting worse or pain that is not controlled with pain medicines.  You have a fever.  You have unexplained weight loss.  You have night sweats. SEEK IMMEDIATE MEDICAL CARE IF:  You cough up blood.  You have difficulty breathing.  Your heartbeat is very fast.   This information is not intended to replace advice given to you by your health care provider. Make sure you discuss any questions you have with your health care provider.   Document Released: 10/18/2010 Document Revised: 01/10/2015 Document Reviewed: 06/28/2014 Elsevier Interactive Patient Education 2016 [place].  Diarrhea Diarrhea is frequent loose and watery bowel movements. It can cause you to feel weak and dehydrated. Dehydration can cause you to become tired and thirsty, have a dry mouth, and have decreased urination that often is dark yellow. Diarrhea is a sign of another problem, most often an infection that will not last long. In most cases, diarrhea typically lasts 2-3 days. However, it can last longer if it is a sign of something more serious. It is important to treat your diarrhea as directed by your caregiver to lessen or prevent future episodes of diarrhea. CAUSES  Some common causes include:  Gastrointestinal infections caused by viruses, bacteria, or parasites.  Food poisoning or food allergies.  Certain medicines, such as antibiotics, chemotherapy, and laxatives.  Artificial sweeteners and fructose.  Digestive disorders. HOME CARE INSTRUCTIONS  Ensure adequate fluid intake (hydration): Have 1  cup (8 oz) of fluid for each diarrhea episode. Avoid fluids that contain simple sugars or sports drinks, fruit juices, whole milk products, and sodas. Your urine should be clear or pale yellow if you are drinking enough fluids. Hydrate with an oral rehydration solution that you can purchase at pharmacies, retail stores, and online. You can prepare an oral  rehydration solution at home by mixing the following ingredients together:   - tsp table salt.   tsp baking soda.   tsp salt substitute containing potassium chloride.  1  tablespoons sugar.  1 L (34 oz) of water.  Certain foods and beverages may increase the speed at which food moves through the gastrointestinal (GI) tract. These foods and beverages should be avoided and include:  Caffeinated and alcoholic beverages.  High-fiber foods, such as raw fruits and vegetables, nuts, seeds, and whole grain breads and cereals.  Foods and beverages sweetened with sugar alcohols, such as xylitol, sorbitol, and mannitol.  Some foods may be well tolerated and may help thicken stool including:  Starchy foods, such as rice, toast, pasta, low-sugar cereal, oatmeal, grits, baked potatoes, crackers, and bagels.  Bananas.  Applesauce.  Add probiotic-rich foods to help increase healthy bacteria in the GI tract, such as yogurt and fermented milk products.  Wash your hands well after each diarrhea episode.  Only take over-the-counter or prescription medicines as directed by your caregiver.  Take a warm bath to relieve any burning or pain from frequent diarrhea episodes. SEEK IMMEDIATE MEDICAL CARE IF:   You are unable to keep fluids down.  You have persistent vomiting.  You have blood in your stool, or your stools are black and tarry.  You do not urinate in 6-8 hours, or there is only a small amount of very dark urine.  You have abdominal pain that increases or localizes.  You have weakness, dizziness, confusion, or light-headedness.  You have a severe headache.  Your diarrhea gets worse or does not get better.  You have a fever or persistent symptoms for more than 2-3 days.  You have a fever and your symptoms suddenly get worse. MAKE SURE YOU:   Understand these instructions.  Will watch your condition.  Will get help right away if you are not doing well or get worse.     This information is not intended to replace advice given to you by your health care provider. Make sure you discuss any questions you have with your health care provider.   Document Released: 04/11/2002 Document Revised: 05/12/2014 Document Reviewed: 12/28/2011 Elsevier Interactive Patient Education 2016 [employer]  Urinary Tract Infection Urinary tract infections (UTIs) can develop anywhere along your urinary tract. Your urinary tract is your body's drainage system for removing wastes and extra water. Your urinary tract includes two kidneys, two ureters, a bladder, and a urethra. Your kidneys are a pair of bean-shaped organs. Each kidney is about the size of your fist. They are located below your ribs, one on each side of your spine. CAUSES Infections are caused by microbes, which are microscopic organisms, including fungi, viruses, and bacteria. These organisms are so small that they can only be seen through a microscope. Bacteria are the microbes that most commonly cause UTIs. SYMPTOMS  Symptoms of UTIs may vary by age and gender of the patient and by the location of the infection. Symptoms in young women typically include a frequent and intense urge to urinate and a painful, burning feeling in the bladder or urethra during urination. Older  women and men are more likely to be tired, shaky, and weak and have muscle aches and abdominal pain. A fever may mean the infection is in your kidneys. Other symptoms of a kidney infection include pain in your back or sides below the ribs, nausea, and vomiting. DIAGNOSIS To diagnose a UTI, your caregiver will ask you about your symptoms. Your caregiver will also ask you to provide a urine sample. The urine sample will be tested for bacteria and white blood cells. White blood cells are made by your body to help fight infection. TREATMENT  Typically, UTIs can be treated with medication. Because most UTIs are caused by a bacterial infection, they usually can  be treated with the use of antibiotics. The choice of antibiotic and length of treatment depend on your symptoms and the type of bacteria causing your infection. HOME CARE INSTRUCTIONS  If you were prescribed antibiotics, take them exactly as your caregiver instructs you. Finish the medication even if you feel better after you have only taken some of the medication.  Drink enough water and fluids to keep your urine clear or pale yellow.  Avoid caffeine, tea, and carbonated beverages. They tend to irritate your bladder.  Empty your bladder often. Avoid holding urine for long periods of time.  Empty your bladder before and after sexual intercourse.  After a bowel movement, women should cleanse from front to back. Use each tissue only once. SEEK MEDICAL CARE IF:   You have back pain.  You develop a fever.  Your symptoms do not begin to resolve within 3 days. SEEK IMMEDIATE MEDICAL CARE IF:   You have severe back pain or lower abdominal pain.  You develop chills.  You have nausea or vomiting.  You have continued burning or discomfort with urination. MAKE SURE YOU:   Understand these instructions.  Will watch your condition.  Will get help right away if you are not doing well or get worse.   This information is not intended to replace advice given to you by your health care provider. Make sure you discuss any questions you have with your health care provider.   Document Released: 01/29/2005 Document Revised: 01/10/2015 Document Reviewed: 05/30/2011 Elsevier Interactive Patient Education [employer].

## 2015-08-14 NOTE — ED Notes (Addendum)
Upon tech entering room, the patient had urinated on the bed and floor. The patient is cleaned up by the tech and wheeled to the bathroom in wheelchair. Bed changed and new linens placed on bed. Floor mopped by EVS as well.

## 2015-08-14 NOTE — ED Provider Notes (Signed)
CSN: [id number]     Arrival date & time 08/14/15  1346 History   First MD Initiated Contact with Patient 08/14/15 1558     Chief Complaint  Patient presents with  . Diarrhea   HPI  Ms. Parello is an 82 year old female presenting with complaints of sensation of worms in her mouth. She states that this has been going on for approximately one year. She reports an episode of sore throat a year ago before onset of symptoms. She states "I feel like there is a thread in my mouth. It is coming out of my throat and in between my teeth. Nobody can seen them but I can feel them". She believes she has tapeworms which are causing this. She has seen multiple doctors for this complaint. She was given "some sort of mouthwash" which she states did not improve her symptoms. She is also complaining of a dry cough 2 weeks. She states that her chest feels congested but she is not coughing up any phlegm. She denies associated chest pain or shortness of breath. She also complains of diarrhea for the past week. States that her stomach cramps before she has a bowel movement. She is not currently experiencing abdominal pain. She denies associated nausea or vomiting. Denies blood in her stool. Patient is difficult to interview as she is fixated on discussing the worms in her mouth.   Her son is in the room and provides background history. He states that his mother has been seen by multiple doctors and diagnosed with delusional parasitosis. He expresses frustration because his mother spends hours a day scraping at her tongue and mouth looking for the worms. She has no diagnosis of dementia though her PCP notes this in her most recent office visit.   Chart review: seen by PCP on 3/28 for same complaint. PCP believes sensation likely from dry mouth secondary to uncontrolled DM and dementia.   Past Medical History  Diagnosis Date  . Diabetes mellitus without complication ([place])   . Hypertension   . Coronary artery disease   .  Arthritis   . Glaucoma   . Cancer [facility]) 2005    Breast   Past Surgical History  Procedure Laterality Date  . Cholecystectomy     Family History  Problem Relation Age of Onset  . Stroke Father   . Diabetes Mother    Social History  Substance Use Topics  . Smoking status: Never Smoker   . Smokeless tobacco: None  . Alcohol Use: No   OB History    No data available     Review of Systems  Respiratory: Positive for cough.   Gastrointestinal: Positive for diarrhea.  All other systems reviewed and are negative.     Allergies  Ivp dye  Home Medications   Prior to Admission medications   Medication Sig Start Date End Date Taking? Authorizing Provider  B-D ULTRAFINE III SHORT PEN 31G X 8 MM MISC See admin instructions. 06/01/14   Historical Provider, MD  cephALEXin (KEFLEX) 500 MG capsule Take 1 capsule (500 mg total) by mouth 2 (two) times daily. 08/14/15   Kiandra Sanguinetti, PA-C  gabapentin (NEURONTIN) 600 MG tablet Take 600 mg by mouth 3 (three) times daily. 06/01/14   Historical Provider, MD  insulin aspart (NOVOLOG) 100 UNIT/ML FlexPen Inject 12 Units into the skin 3 (three) times daily with meals. 08/27/14   Nishant Dhungel, MD  LANTUS SOLOSTAR 100 UNIT/ML Solostar Pen Inject 90 Units into the skin at bedtime. 08/27/14  Nishant Dhungel, MD  losartan (COZAAR) 50 MG tablet Take 2 tablets (100 mg total) by mouth daily. 08/27/14   Nishant Dhungel, MD  lovastatin (MEVACOR) 40 MG tablet Take 40 mg by mouth at bedtime. 06/05/14   Historical Provider, MD  metoprolol succinate (TOPROL-XL) 50 MG 24 hr tablet Take 50 mg by mouth daily. Take with or immediately following a meal.    Historical Provider, MD  NOVOFINE PLUS 32G X 4 MM MISC  06/01/14   Historical Provider, MD  omega-3 acid ethyl esters (LOVAZA) 1 G capsule Take 4 g by mouth daily.  06/01/14   Historical Provider, MD  timolol (TIMOPTIC) 0.5 % ophthalmic solution Place 1 drop into the left eye every morning. Use the same time every  morning. 07/12/14   Historical Provider, MD  traMADol (ULTRAM) 50 MG tablet Take 1 tablet (50 mg total) by mouth 3 (three) times daily as needed for moderate pain (leg pains). 08/27/14   Nishant Dhungel, MD  Travoprost, BAK Free, (TRAVATAN) 0.004 % SOLN ophthalmic solution Place 1 drop into both eyes at bedtime.     Historical Provider, MD   BP 160/83 mmHg  Pulse 69  Resp 18  SpO2 100% Physical Exam  Constitutional: She appears well-developed and well-nourished. No distress.  HENT:  Head: Normocephalic and atraumatic.  Right Ear: External ear normal.  Left Ear: External ear normal.  Mouth/Throat: Uvula is midline. Mucous membranes are dry. No oropharyngeal exudate, posterior oropharyngeal edema or posterior oropharyngeal erythema.  No worms or other foreign bodies present in oropharynx  Eyes: Conjunctivae are normal. Right eye exhibits no discharge. Left eye exhibits no discharge. No scleral icterus.  Neck: Normal range of motion. Neck supple.  Cardiovascular: Normal rate, regular rhythm and normal heart sounds.   Pulmonary/Chest: Effort normal and breath sounds normal. No respiratory distress.  Abdominal: Soft. She exhibits no distension. There is no tenderness.  Musculoskeletal: Normal range of motion.  Moves all extremities spontaneously  Neurological: She is alert. Coordination normal.  Skin: Skin is warm and dry.  Psychiatric: Her behavior is normal. Her mood appears anxious. Thought content is delusional.  Nursing note and vitals reviewed.   ED Course  Procedures (including critical care time) Labs Review Labs Reviewed  URINALYSIS, ROUTINE W REFLEX MICROSCOPIC (NOT AT [facility]) - Abnormal; Notable for the following:    APPearance TURBID (*)    Glucose, UA >1000 (*)    Hgb urine dipstick MODERATE (*)    Protein, ur >300 (*)    Leukocytes, UA MODERATE (*)    All other components within normal limits  URINE MICROSCOPIC-ADD ON - Abnormal; Notable for the following:    Squamous  Epithelial / LPF 0-5 (*)    Bacteria, UA MANY (*)    All other components within normal limits  CBC WITH DIFFERENTIAL/PLATELET - Abnormal; Notable for the following:    RBC 3.07 (*)    Hemoglobin 9.6 (*)    HCT 28.4 (*)    All other components within normal limits  COMPREHENSIVE METABOLIC PANEL - Abnormal; Notable for the following:    Sodium 130 (*)    Chloride 99 (*)    Glucose, Bld 379 (*)    BUN 37 (*)    Creatinine, Ser 1.29 (*)    Calcium 8.8 (*)    Albumin 3.3 (*)    AST 58 (*)    GFR calc non Af Amer 38 (*)    GFR calc Af Amer 44 (*)    All other  components within normal limits  CBG MONITORING, ED - Abnormal; Notable for the following:    Glucose-Capillary 222 (*)    All other components within normal limits    Imaging Review Dg Chest 2 View  08/14/2015  CLINICAL DATA:  One week of cough ; history of coronary artery disease, breast malignancy, diabetes. Patient voiced concerns or possible take forearms. EXAM: CHEST  2 VIEW COMPARISON:  PA and lateral chest x-ray of March 28, 2015 FINDINGS: The left lung is well-expanded and clear. On the right there is stable increased density in the perihilar and infrahilar regions. There is no alveolar infiltrate. There is no pleural effusion. The heart and pulmonary vascularity are normal. The mediastinum is normal in width. The trachea is midline. There is multilevel degenerative disc disease of the thoracic spine. There is moderate degenerative change of the left shoulder. IMPRESSION: There is no active cardiopulmonary disease. Electronically Signed   By: David  Martinique M.D.   On: 08/14/2015 14:50   I have personally reviewed and evaluated these images and lab results as part of my medical decision-making.   EKG Interpretation None      MDM   Final diagnoses:  UTI (lower urinary tract infection)  Cough  Diarrhea, unspecified type  Abnormal mouth sensation   82 year old female presenting with sensation of worms in her mouth  for the past year. On review of systems, she also notes a dry cough for the past 2 weeks and diarrhea for the past week. Patient remains focused on the worms during interview and exam. Seen 2 weeks ago by her PCP for same complaint. Patient is nontoxic-appearing. No worms or other foreign bodies noted in her oropharynx. Mucous membranes are dry. Lungs are clear to auscultation bilaterally. Abdomen is soft, nontender without peritoneal signs. No leukocytosis. Hemoglobin is at her baseline. Sodium and chloride low; patient appears dehydrated. Creatinine and BUN elevated; chart review shows that this is chronic and creatinine has been 1.29 the past. Glucose noted to be 379. Urinalysis positive for urinary tract infection. Chest x-ray negative. Given a liter fluid bolus and insulin. Glucose prior to discharge 222. Presentation consistent with a delusional parasitosis. Discussed this with patient's son who agrees and states previous doctors have told her this. He is concerned that she is developing dementia. Discussed with patient that he we'll need to insure close follow-up with her PCP. Reassured patient that there are no worms visible in her mouth. Encouraged patient to schedule follow-up appointment with her PCP in 2-3 days.  At this time there does not appear to be any evidence of an acute emergency medical condition and the patient appears stable for discharge with appropriate outpatient follow up. Diagnosis was discussed with patient who verbalizes understanding and is agreeable to discharge. Pt case discussed with Dr. Stark Jock who agrees with my plan. Return precautions given in discharge paperwork and discussed with pt at bedside. Pt is stable for discharge.     Lahoma Crocker Mikyle Sox, PA-C 08/14/15 2332  Veryl Speak, MD 08/18/15 2221

## 2015-08-16 ENCOUNTER — Emergency Department (HOSPITAL_COMMUNITY): Payer: Medicare PPO

## 2015-08-16 ENCOUNTER — Inpatient Hospital Stay (HOSPITAL_COMMUNITY)
Admission: EM | Admit: 2015-08-16 | Discharge: 2015-08-20 | DRG: 603 | Disposition: A | Discharge status: Home Health | Payer: Medicare PPO | Attending: Family Medicine | Admitting: Family Medicine

## 2015-08-16 ENCOUNTER — Encounter (HOSPITAL_COMMUNITY): Payer: Self-pay | Admitting: *Deleted

## 2015-08-16 DIAGNOSIS — E1149 Type 2 diabetes mellitus with other diabetic neurological complication: Secondary | ICD-10-CM

## 2015-08-16 DIAGNOSIS — B9620 Unspecified Escherichia coli [E. coli] as the cause of diseases classified elsewhere: Secondary | ICD-10-CM | POA: Diagnosis present

## 2015-08-16 DIAGNOSIS — L97929 Non-pressure chronic ulcer of unspecified part of left lower leg with unspecified severity: Secondary | ICD-10-CM | POA: Diagnosis present

## 2015-08-16 DIAGNOSIS — Z853 Personal history of malignant neoplasm of breast: Secondary | ICD-10-CM

## 2015-08-16 DIAGNOSIS — Z955 Presence of coronary angioplasty implant and graft: Secondary | ICD-10-CM

## 2015-08-16 DIAGNOSIS — N390 Urinary tract infection, site not specified: Secondary | ICD-10-CM | POA: Diagnosis present

## 2015-08-16 DIAGNOSIS — I25119 Atherosclerotic heart disease of native coronary artery with unspecified angina pectoris: Secondary | ICD-10-CM | POA: Insufficient documentation

## 2015-08-16 DIAGNOSIS — E1142 Type 2 diabetes mellitus with diabetic polyneuropathy: Secondary | ICD-10-CM | POA: Diagnosis present

## 2015-08-16 DIAGNOSIS — H5441 Blindness, right eye, normal vision left eye: Secondary | ICD-10-CM | POA: Diagnosis present

## 2015-08-16 DIAGNOSIS — I10 Essential (primary) hypertension: Secondary | ICD-10-CM | POA: Diagnosis present

## 2015-08-16 DIAGNOSIS — E11622 Type 2 diabetes mellitus with other skin ulcer: Secondary | ICD-10-CM | POA: Diagnosis present

## 2015-08-16 DIAGNOSIS — R739 Hyperglycemia, unspecified: Secondary | ICD-10-CM

## 2015-08-16 DIAGNOSIS — L0390 Cellulitis, unspecified: Secondary | ICD-10-CM

## 2015-08-16 DIAGNOSIS — F22 Delusional disorders: Secondary | ICD-10-CM | POA: Insufficient documentation

## 2015-08-16 DIAGNOSIS — L03116 Cellulitis of left lower limb: Principal | ICD-10-CM | POA: Diagnosis present

## 2015-08-16 DIAGNOSIS — E1165 Type 2 diabetes mellitus with hyperglycemia: Secondary | ICD-10-CM | POA: Diagnosis present

## 2015-08-16 DIAGNOSIS — R441 Visual hallucinations: Secondary | ICD-10-CM | POA: Diagnosis present

## 2015-08-16 DIAGNOSIS — E11319 Type 2 diabetes mellitus with unspecified diabetic retinopathy without macular edema: Secondary | ICD-10-CM | POA: Diagnosis present

## 2015-08-16 DIAGNOSIS — Z833 Family history of diabetes mellitus: Secondary | ICD-10-CM

## 2015-08-16 DIAGNOSIS — Z91041 Radiographic dye allergy status: Secondary | ICD-10-CM

## 2015-08-16 DIAGNOSIS — I2510 Atherosclerotic heart disease of native coronary artery without angina pectoris: Secondary | ICD-10-CM | POA: Diagnosis present

## 2015-08-16 DIAGNOSIS — Z79899 Other long term (current) drug therapy: Secondary | ICD-10-CM

## 2015-08-16 DIAGNOSIS — Z794 Long term (current) use of insulin: Secondary | ICD-10-CM

## 2015-08-16 LAB — VITAMIN B12: Vitamin B-12: 552 pg/mL (ref 180–914)

## 2015-08-16 LAB — C-REACTIVE PROTEIN: CRP: 2 mg/dL — AB (ref <1.0)

## 2015-08-16 LAB — CBC
HEMATOCRIT: 30.6 % — AB (ref 36.0–46.0)
Hemoglobin: 10.1 g/dL — ABNORMAL LOW (ref 12.0–15.0)
MCH: 30.6 pg (ref 26.0–34.0)
MCHC: 33 g/dL (ref 30.0–36.0)
MCV: 92.7 fL (ref 78.0–100.0)
Platelets: 247 10*3/uL (ref 150–400)
RBC: 3.3 MIL/uL — AB (ref 3.87–5.11)
RDW: 12.9 % (ref 11.5–15.5)
WBC: 7.5 10*3/uL (ref 4.0–10.5)

## 2015-08-16 LAB — LIPID PANEL
CHOL/HDL RATIO: 5.3 ratio
Cholesterol: 174 mg/dL (ref 0–200)
HDL: 33 mg/dL — AB (ref >40)
LDL Cholesterol: UNDETERMINED mg/dL (ref 0–99)
Triglycerides: 591 mg/dL — ABNORMAL HIGH (ref <150)
VLDL: UNDETERMINED mg/dL (ref 0–40)

## 2015-08-16 LAB — COMPREHENSIVE METABOLIC PANEL
ALBUMIN: 3.3 g/dL — AB (ref 3.5–5.0)
ALK PHOS: 89 U/L (ref 38–126)
ALT: 26 U/L (ref 14–54)
ANION GAP: 12 (ref 5–15)
AST: 36 U/L (ref 15–41)
BILIRUBIN TOTAL: 0.6 mg/dL (ref 0.3–1.2)
BUN: 35 mg/dL — ABNORMAL HIGH (ref 6–20)
CALCIUM: 9.2 mg/dL (ref 8.9–10.3)
CO2: 22 mmol/L (ref 22–32)
Chloride: 98 mmol/L — ABNORMAL LOW (ref 101–111)
Creatinine, Ser: 1.35 mg/dL — ABNORMAL HIGH (ref 0.44–1.00)
GFR calc non Af Amer: 36 mL/min — ABNORMAL LOW (ref >60)
GFR, EST AFRICAN AMERICAN: 42 mL/min — AB (ref >60)
GLUCOSE: 496 mg/dL — AB (ref 65–99)
POTASSIUM: 4.5 mmol/L (ref 3.5–5.1)
Sodium: 132 mmol/L — ABNORMAL LOW (ref 135–145)
TOTAL PROTEIN: 7.7 g/dL (ref 6.5–8.1)

## 2015-08-16 LAB — I-STAT CG4 LACTIC ACID, ED
LACTIC ACID, VENOUS: 1.86 mmol/L (ref 0.5–2.0)
LACTIC ACID, VENOUS: 1.01 mmol/L (ref 0.5–2.0)

## 2015-08-16 LAB — CBG MONITORING, ED: Glucose-Capillary: 447 mg/dL — ABNORMAL HIGH (ref 65–99)

## 2015-08-16 LAB — GLUCOSE, CAPILLARY: GLUCOSE-CAPILLARY: 285 mg/dL — AB (ref 65–99)

## 2015-08-16 LAB — TSH: TSH: 0.579 u[IU]/mL (ref 0.350–4.500)

## 2015-08-16 LAB — SEDIMENTATION RATE: Sed Rate: >140 mm/hr — ABNORMAL HIGH (ref 0–22)

## 2015-08-16 MED ORDER — VANCOMYCIN HCL IN DEXTROSE 1-5 GM/200ML-% IV SOLN
1000.0000 mg | Freq: Once | INTRAVENOUS | Status: AC
  Administered 2015-08-16: 1000 mg via INTRAVENOUS
  Filled 2015-08-16: qty 200

## 2015-08-16 MED ORDER — ASPIRIN EC 81 MG PO TBEC
81.0000 mg | DELAYED_RELEASE_TABLET | Freq: Every day | ORAL | Status: DC
  Administered 2015-08-16 – 2015-08-20 (×5): 81 mg via ORAL
  Filled 2015-08-16 (×5): qty 1

## 2015-08-16 MED ORDER — PIPERACILLIN-TAZOBACTAM 3.375 G IVPB 30 MIN
3.3750 g | Freq: Once | INTRAVENOUS | Status: AC
  Administered 2015-08-17: 3.375 g via INTRAVENOUS
  Filled 2015-08-16: qty 50

## 2015-08-16 MED ORDER — VANCOMYCIN HCL IN DEXTROSE 1-5 GM/200ML-% IV SOLN
1000.0000 mg | INTRAVENOUS | Status: DC
  Administered 2015-08-17: 1000 mg via INTRAVENOUS
  Filled 2015-08-16 (×2): qty 200

## 2015-08-16 MED ORDER — LOSARTAN POTASSIUM 50 MG PO TABS
100.0000 mg | ORAL_TABLET | Freq: Every day | ORAL | Status: DC
  Administered 2015-08-17 – 2015-08-20 (×4): 100 mg via ORAL
  Filled 2015-08-16 (×4): qty 2

## 2015-08-16 MED ORDER — INSULIN ASPART 100 UNIT/ML ~~LOC~~ SOLN
0.0000 [IU] | Freq: Every day | SUBCUTANEOUS | Status: DC
  Administered 2015-08-16 – 2015-08-17 (×2): 3 [IU] via SUBCUTANEOUS
  Administered 2015-08-18: 2 [IU] via SUBCUTANEOUS

## 2015-08-16 MED ORDER — PIPERACILLIN-TAZOBACTAM 3.375 G IVPB
3.3750 g | Freq: Three times a day (TID) | INTRAVENOUS | Status: DC
  Filled 2015-08-16 (×3): qty 50

## 2015-08-16 MED ORDER — TRAMADOL HCL 50 MG PO TABS
50.0000 mg | ORAL_TABLET | Freq: Three times a day (TID) | ORAL | Status: DC | PRN
  Administered 2015-08-16 – 2015-08-20 (×9): 50 mg via ORAL
  Filled 2015-08-16 (×9): qty 1

## 2015-08-16 MED ORDER — SODIUM CHLORIDE 0.9 % IV BOLUS (SEPSIS)
1000.0000 mL | Freq: Once | INTRAVENOUS | Status: AC
  Administered 2015-08-16: 1000 mL via INTRAVENOUS

## 2015-08-16 MED ORDER — VANCOMYCIN HCL IN DEXTROSE 750-5 MG/150ML-% IV SOLN
750.0000 mg | Freq: Once | INTRAVENOUS | Status: AC
  Administered 2015-08-16: 750 mg via INTRAVENOUS
  Filled 2015-08-16 (×2): qty 150

## 2015-08-16 MED ORDER — LATANOPROST 0.005 % OP SOLN
1.0000 [drp] | Freq: Every day | OPHTHALMIC | Status: DC
  Administered 2015-08-16 – 2015-08-19 (×4): 1 [drp] via OPHTHALMIC
  Filled 2015-08-16 (×2): qty 2.5

## 2015-08-16 MED ORDER — ATORVASTATIN CALCIUM 40 MG PO TABS
40.0000 mg | ORAL_TABLET | Freq: Every day | ORAL | Status: DC
  Administered 2015-08-16 – 2015-08-19 (×4): 40 mg via ORAL
  Filled 2015-08-16 (×4): qty 1

## 2015-08-16 MED ORDER — GABAPENTIN 600 MG PO TABS
600.0000 mg | ORAL_TABLET | Freq: Three times a day (TID) | ORAL | Status: DC
  Administered 2015-08-16: 600 mg via ORAL
  Filled 2015-08-16: qty 1

## 2015-08-16 MED ORDER — METOPROLOL SUCCINATE ER 50 MG PO TB24
50.0000 mg | ORAL_TABLET | Freq: Every day | ORAL | Status: DC
  Administered 2015-08-16 – 2015-08-20 (×5): 50 mg via ORAL
  Filled 2015-08-16 (×4): qty 1

## 2015-08-16 MED ORDER — ACETAMINOPHEN 325 MG PO TABS
650.0000 mg | ORAL_TABLET | Freq: Four times a day (QID) | ORAL | Status: DC | PRN
  Administered 2015-08-18 (×2): 650 mg via ORAL
  Filled 2015-08-16 (×2): qty 2

## 2015-08-16 MED ORDER — ENOXAPARIN SODIUM 40 MG/0.4ML ~~LOC~~ SOLN
40.0000 mg | SUBCUTANEOUS | Status: DC
  Administered 2015-08-16 – 2015-08-19 (×4): 40 mg via SUBCUTANEOUS
  Filled 2015-08-16 (×4): qty 0.4

## 2015-08-16 MED ORDER — ENOXAPARIN SODIUM 40 MG/0.4ML ~~LOC~~ SOLN: 40.0000 mg | SUBCUTANEOUS | Status: DC

## 2015-08-16 MED ORDER — INSULIN ASPART 100 UNIT/ML ~~LOC~~ SOLN
0.0000 [IU] | Freq: Three times a day (TID) | SUBCUTANEOUS | Status: DC
  Administered 2015-08-17: 3 [IU] via SUBCUTANEOUS
  Administered 2015-08-17 (×2): 5 [IU] via SUBCUTANEOUS
  Administered 2015-08-18: 3 [IU] via SUBCUTANEOUS
  Administered 2015-08-18 (×2): 5 [IU] via SUBCUTANEOUS
  Administered 2015-08-19: 3 [IU] via SUBCUTANEOUS
  Administered 2015-08-19: 8 [IU] via SUBCUTANEOUS

## 2015-08-16 MED ORDER — INSULIN GLARGINE 100 UNIT/ML ~~LOC~~ SOLN
45.0000 [IU] | Freq: Every day | SUBCUTANEOUS | Status: DC
  Administered 2015-08-16 – 2015-08-19 (×4): 45 [IU] via SUBCUTANEOUS
  Filled 2015-08-16 (×5): qty 0.45

## 2015-08-16 MED ORDER — TIMOLOL MALEATE 0.5 % OP SOLN
1.0000 [drp] | Freq: Every day | OPHTHALMIC | Status: DC
  Administered 2015-08-17 – 2015-08-20 (×4): 1 [drp] via OPHTHALMIC
  Filled 2015-08-16: qty 5

## 2015-08-16 MED ORDER — TIMOLOL MALEATE 0.5 % OP SOLN: 1.0000 [drp] | OPHTHALMIC | Status: DC

## 2015-08-16 NOTE — Progress Notes (Signed)
Patient is admitted to room 5W17 at this time. Alert and in stable condition.

## 2015-08-16 NOTE — Progress Notes (Signed)
Family Medicine Teaching Service Daily Progress Note Intern Pager: [phone]  Patient name: Natasha Sexton Medical record number: [id number] Date of birth: January 09, 1933 Age: 82 y.o. Gender: female  Primary Care Provider: Helane Rima, MD Consultants: none Code Status: FULL  Pt Overview and Major Events to Date:  4/13 - admitted to [facility]; started on vanc/zosyn  Assessment and Plan: Natasha Sexton is a 82 y.o. female presenting with leg ulcer & visual hallucination. PMH is significant for DM-2, HTN, CAD, diabetic neuropathy.  Cellulitic ulcer in diabetic patients: after traumatic injury ~1 mo ago. No signs of systemic infection. Patient without fever or leukocytosis. Exam remarkable for ulcer over lateral aspect of lower L leg ~2 in in diameter with surrounding erythema and warmth. No fluctuance. WBC 7.5, LA WNL on admission. Sed rate and CRP elevated. Wound and blood cx collected, however blood cx obtained after abx started. LLE x-ray normal. Doubt osteomyelitis but cannot rule out. LE>RE likely due to cellulitis than DVT. WBC still WNL at 6.5 this AM.  - Vanc and zosyn (4/13>>) in diabetic patient - F/u wound, blood culture - Can consider MRI to rule out osteo - Wound care consult - Tylenol PRN pain  Visual hallucination (delusional parasitosis): Believes worms are coming out of her mouth. Ongoing for past two wks per family. No prior psychiatric history. Negative head CT in 11/2014. Ddx includes delirium, dementia & psychosis. Responds to questions appropriately. Vit B12, TSH WNL. No mention of hallucinations overnight or this AM.  - F/u RPR - Consider mini cog   UTI: Endorses dysuria. Pyelo less likely without systemic symptoms and CVA tenderness on exam. On Keflex as outpatient (4/11 to admission on 4/13). Unclear if she was taking it. UA on admission with small hemoglobin, but no nitrites or leukocytes (improvement over UA obtained two days prior to presentation).  - F/u  urine culture (obtained after abx initiated)  - Cont vanc/zosyn    Elevated creatinine: 1.35 on admission. Baseline 1.1-1.2.  Improved to 1.1 this AM.  - Encourage PO intake   Cough: Doubt cardiac or pulmonary process with isolated cough. No fever, chest pain or sign of fluid overload. CXR normal 2d prior to admission. O2 sat in high 90s on RA.  - Cont to monitor  Hypertension: hypertensive on arrival (max 195/82), but had not taken meds that day. On losartan and Metoprolol XL at home. BP 179/70, 132/57 overnight.  -Continue home losartan and metoprolol XL  CAD: s/p stent placement in 2008 per patient. No echo in chart. On lovastatin at home. Currently asymptomatic.  - Continue metoprolol - F/u lipid panel - Atorvastatin 40 mg  - Recommend outpatient stress test after d/c  Hyperglycemia/DM-2: Poorly controlled. A1c 15 in 08/2014. BG 500 on arrival, but missed dose of Lantus. Not in DKA or HSS. Reports taking 40U BID, but prior records report 90U at bedtime. Also on Novolog for meal coverage. CBG 285 overnight.  - Lantus 45U at bedtime  - SSI-moderate sensititivity - F/u A1c - ACHS CBG - Cont gabapentin  Diabetic neuropathy: Markedly reduced sensation in bilateral LE. On gabapentin 600mg  TID at home, but not complaining of parasthesias. Gabapentin could be contributing to mental status changes.  - Decrease gabapentin to 100mg  TID given potential for sedation/mental status change - Monitor for parasthesias and resume gabapentin at lower dose if necessary  FEN/GI: Heart healthy and carb modified, saline lock Prophylaxis: Lovenox  Disposition: pending medical improvement  Subjective:  Patient with no complaints this AM. Completely oriented, and  even able to state why she is hospitalized. No mention of hallucinations this AM. No episodes of agitation overnight.   Objective: Temp:  [97.7 F (36.5 C)-98.8 F (37.1 C)] 98 F (36.7 C) (04/14 0607) Pulse Rate:  [65-88] 74 (04/14  0607) Resp:  [11-19] 18 (04/14 0607) BP: (132-195)/(57-89) 132/57 mmHg (04/14 0607) SpO2:  [94 %-100 %] 100 % (04/14 0607) Weight:  [199 lb 8 oz (90.493 kg)] 199 lb 8 oz (90.493 kg) (04/13 1246) Physical Exam: General: sleeping comfortably in bed but easily able to arouse; in NAD Cardiovascular: RRR, no murmurs appreciated Respiratory: CTAB, no wheezes, normal work of breathing on RA Abdomen: soft, non-tender, non-distended, +BS Extremities: ulcerated lesion ~5x3cm at widest point with surrounding erythema, non-tender to palpation Neuro: reduced sensation to touch in bilateral LE; A&Ox4, no focal deficits   Laboratory:  Recent Labs Lab 08/14/15 1612 08/16/15 1238 08/17/15 0438  WBC 8.1 7.5 6.5  HGB 9.6* 10.1* 9.1*  HCT 28.4* 30.6* 28.1*  PLT 229 247 230    Recent Labs Lab 08/14/15 1624 08/16/15 1238 08/17/15 0438  NA 130* 132* 138  K 4.4 4.5 3.7  CL 99* 98* 105  CO2 24 22 23   BUN 37* 35* 23*  CREATININE 1.29* 1.35* 1.10*  CALCIUM 8.8* 9.2 8.7*  PROT 7.3 7.7  --   BILITOT 0.6 0.6  --   ALKPHOS 87 89  --   ALT 25 26  --   AST 58* 36  --   GLUCOSE 379* 496* 201*    Imaging/Diagnostic Tests: Dg Tibia/fibula Left 08/16/2015  CLINICAL DATA:  Infected wound in left lower leg. EXAM: LEFT TIBIA AND FIBULA - 2 VIEW. IMPRESSION: No significant abnormality seen involving the left tibia or fibula.  Verner Mould, MD 08/17/2015, 8:12 AM PGY-1, [facility] Intern pager: [phone], text pages welcome

## 2015-08-16 NOTE — ED Notes (Signed)
Pt here for wound tx.  States wound to lat LLE for several weeks, but now open and draining.  CBG 447.  Daughter pulled RN aside and stated pt was discharged from [facility] on 4/11 for UTI.  At that time she stated she had worms coming out of her mouth.  Presently in triage pt is trying to get the tape worm out of her mouth, though there is nothing there.  Pt ao x 4.

## 2015-08-16 NOTE — ED Provider Notes (Signed)
CSN: [id number]     Arrival date & time 08/16/15  1236 History   First MD Initiated Contact with Patient 08/16/15 1351     Chief Complaint  Patient presents with  . Altered Mental Status  . Wound Infection     (Consider location/radiation/quality/duration/timing/severity/associated sxs/prior Treatment) HPI  Natasha Sexton Is an 82 year old female with a past medical history of diabetes and delusional parasitosis. The patient presents with complaint of pain in her left leg. She has a poorly healing diabetic ulcer of the left lower extremity. She states that the scab fell off of it and over the past few days. She's had worsening pain and swelling. She is also noticed that fluid discharge that she describes as yellowish and clear. She denies fevers or chills. The patient complains of a sensation of a worm in her gums that goes down her teeth and is actively trying to remove the worms from her gums. She is complained of this in previous visits and was seen about 2 days ago with complaint of worms in her mouth and cough.  Past Medical History  Diagnosis Date  . Diabetes mellitus without complication ([place])   . Hypertension   . Coronary artery disease   . Arthritis   . Glaucoma   . Cancer [facility]) 2005    Breast   Past Surgical History  Procedure Laterality Date  . Cholecystectomy     Family History  Problem Relation Age of Onset  . Stroke Father   . Diabetes Mother    Social History  Substance Use Topics  . Smoking status: Never Smoker   . Smokeless tobacco: None  . Alcohol Use: No   OB History    No data available     Review of Systems  Ten systems reviewed and are negative for acute change, except as noted in the HPI.    Allergies  Ivp dye  Home Medications   Prior to Admission medications   Medication Sig Start Date End Date Taking? Authorizing Provider  B-D ULTRAFINE III SHORT PEN 31G X 8 MM MISC See admin instructions. 06/01/14   Historical Provider, MD   cephALEXin (KEFLEX) 500 MG capsule Take 1 capsule (500 mg total) by mouth 2 (two) times daily. 08/14/15   Stevi Barrett, PA-C  gabapentin (NEURONTIN) 600 MG tablet Take 600 mg by mouth 3 (three) times daily. 06/01/14   Historical Provider, MD  insulin aspart (NOVOLOG) 100 UNIT/ML FlexPen Inject 12 Units into the skin 3 (three) times daily with meals. 08/27/14   Nishant Dhungel, MD  LANTUS SOLOSTAR 100 UNIT/ML Solostar Pen Inject 90 Units into the skin at bedtime. 08/27/14   Nishant Dhungel, MD  losartan (COZAAR) 50 MG tablet Take 2 tablets (100 mg total) by mouth daily. 08/27/14   Nishant Dhungel, MD  lovastatin (MEVACOR) 40 MG tablet Take 40 mg by mouth at bedtime. 06/05/14   Historical Provider, MD  metoprolol succinate (TOPROL-XL) 50 MG 24 hr tablet Take 50 mg by mouth daily. Take with or immediately following a meal.    Historical Provider, MD  NOVOFINE PLUS 32G X 4 MM MISC  06/01/14   Historical Provider, MD  omega-3 acid ethyl esters (LOVAZA) 1 G capsule Take 4 g by mouth daily.  06/01/14   Historical Provider, MD  timolol (TIMOPTIC) 0.5 % ophthalmic solution Place 1 drop into the left eye every morning. Use the same time every morning. 07/12/14   Historical Provider, MD  traMADol (ULTRAM) 50 MG tablet Take 1 tablet (50  mg total) by mouth 3 (three) times daily as needed for moderate pain (leg pains). 08/27/14   Nishant Dhungel, MD  Travoprost, BAK Free, (TRAVATAN) 0.004 % SOLN ophthalmic solution Place 1 drop into both eyes at bedtime.     Historical Provider, MD   BP 149/74 mmHg  Pulse 69  Temp(Src) 97.7 F (36.5 C) (Oral)  Resp 14  Ht 5\' 4"  (1.626 m)  Wt 90.493 kg  BMI 34.23 kg/m2  SpO2 99% Physical Exam  Constitutional: She is oriented to person, place, and time. She appears well-developed and well-nourished. No distress.  HENT:  Head: Normocephalic and atraumatic.  A graphic tong, dry mucosa, no evidence of worms in the patient's teeth or gum. Poor dentition with poor dental hygiene  Eyes:  Conjunctivae are normal. No scleral icterus.  Neck: Normal range of motion.  Cardiovascular: Normal rate, regular rhythm and normal heart sounds.  Exam reveals no gallop and no friction rub.   No murmur heard. Pulmonary/Chest: Effort normal and breath sounds normal. No respiratory distress.  Abdominal: Soft. Bowel sounds are normal. She exhibits no distension and no mass. There is no tenderness. There is no guarding.  Musculoskeletal:  Left lower extremity with about a 10 cm stage III ulceration of the left lower extremity. She is circumferential heat and redness with tenderness to palpation. No fluctuance. Distal pulses are intact. She has bilateral 2+ pitting edema  Neurological: She is alert and oriented to person, place, and time.  Skin: Skin is warm and dry. She is not diaphoretic.  Nursing note and vitals reviewed.   ED Course  Procedures (including critical care time) Labs Review Labs Reviewed  COMPREHENSIVE METABOLIC PANEL - Abnormal; Notable for the following:    Sodium 132 (*)    Chloride 98 (*)    Glucose, Bld 496 (*)    BUN 35 (*)    Creatinine, Ser 1.35 (*)    Albumin 3.3 (*)    GFR calc non Af Amer 36 (*)    GFR calc Af Amer 42 (*)    All other components within normal limits  CBC - Abnormal; Notable for the following:    RBC 3.30 (*)    Hemoglobin 10.1 (*)    HCT 30.6 (*)    All other components within normal limits  CBG MONITORING, ED - Abnormal; Notable for the following:    Glucose-Capillary 447 (*)    All other components within normal limits  URINALYSIS, ROUTINE W REFLEX MICROSCOPIC (NOT AT [facility])  CBG MONITORING, ED  I-STAT CG4 LACTIC ACID, ED  I-STAT CG4 LACTIC ACID, ED    Imaging Review Dg Tibia/fibula Left  08/16/2015  CLINICAL DATA:  Infected wound in left lower leg. EXAM: LEFT TIBIA AND FIBULA - 2 VIEW COMPARISON:  None. FINDINGS: There is no evidence of fracture or other focal bone lesions. Soft tissues are unremarkable. Degenerative joint disease  of the left knee is noted. IMPRESSION: No significant abnormality seen involving the left tibia or fibula. Electronically Signed   By: Marijo Conception, M.D.   On: 08/16/2015 15:07   I have personally reviewed and evaluated these images and lab results as part of my medical decision-making.   EKG Interpretation None      MDM   Final diagnoses:  Wound cellulitis    Patient with cellulitis of the Left lower extremity. + Hyperglycemia. Will admit for observation.    Margarita Mail, PA-C 08/19/15 [place], MD 08/20/15 1028

## 2015-08-16 NOTE — Progress Notes (Signed)
Pharmacy Antibiotic Note  Natasha Sexton is a 82 y.o. female here with diarrhea. Pharmacy has been consulted to dose vancomycin and zosyn for cellulitis. Zosyn 3.375gm and Vancomycin 1gm have been ordered in the ED -WBC= 7.5, afeb, SCr= 1.35 and CrCl ~ 35  Plan: -Vancomycin 750mg  IV x1 to complete a total load of 1750mg  IV -Vancomycin 1000mg  IV q24h; goal vancomycin trough: 10-15 -Zosyn 3.375gm IV q8h -Will follow renal function, cultures and clinical progress   Height: 5\' 4"  (162.6 cm) Weight: 199 lb 8 oz (90.493 kg) IBW/kg (Calculated) : 54.7  Temp (24hrs), Avg:98.4 F (36.9 C), Min:97.7 F (36.5 C), Max:98.8 F (37.1 C)   Recent Labs Lab 08/14/15 1612 08/14/15 1624 08/16/15 1238 08/16/15 1322 08/16/15 1618  WBC 8.1  --  7.5  --   --   CREATININE  --  1.29* 1.35*  --   --   LATICACIDVEN  --   --   --  1.86 1.01    Estimated Creatinine Clearance: 36.2 mL/min (by C-G formula based on Cr of 1.35).    Allergies  Allergen Reactions  . Ivp Dye [Iodinated Diagnostic Agents]     Rash     Antimicrobials this admission: 4/13 zosyn 4/13 vanc  Microbiology results: 4/13 blood x2 4/13 urine 4/13 wound  Thank you for allowing pharmacy to be a part of this patient's care.  Dareen Piano 08/16/2015 7:24 PM

## 2015-08-16 NOTE — H&P (Signed)
[facility] Admission History and Physical Service Pager: [phone]  Patient name: Natasha Sexton Medical record number: [id number] Date of birth: [birth year] Age: 82 y.o. Gender: female  Primary Care Provider: Helane Rima, MD Consultants: none Code Status: full (on admission)  Chief Complaint: worm in my mouth  Assessment and Plan: Allan Bacigalupi is a 82 y.o. female presenting with leg ulcer & visual hallucination. PMH is significant for DM-2, HTN, CAD, diabetic neuropathy.  Cellulitic ulcer in diabetic patients: after traumatic injury about a month ago. No signs of systemic infection. Patient without fever or leukocytosis. Exam remarkable for an ulcer over the lateral aspect of her lower third of her left leg about 2 inches in diameter with surrounding erythematous skin. The surrounding skin feels warm to touch. No sign of underlying fluid loculation or drainable abscess. WBC 7.5. Lactic acid normal. LLE x-ray normal. Doubt osteomyelitis but cannot rule out. LE>RE likely due to cellulitis than DVT. Hoffman negative. -Will admit to floor. Attending Dr. Andria Frames -CRP and ESR -Vanc and zosyn (4/13>>) in diabetic patient -Wound culture -Can consider MRI to rule out bone involvement -[facility] -Tylenol as needed for pain -Consider PVL but low suspesion for DVT - blood culture were obtained after ABX  Visual hallucination (delusional parasitosis): this has been going on for two weeks per family. No prior psychiatric history. Differential diagnosis are delirium, dementia & psychosis. Responds to question appropriately. Will need to have further discussion with the family as they were not present during admission.   - order Vit B12, TSH, RPR - can consider mini cog  - Head CT negative in 11/2014 -Will monitor   UTI: reports dysuria. Doubt pyelo without systemic symptoms and CVA tenderness on exam. On Keflex as outpatient (4/11 to present). Unclear if she was  taking it.  -Will obtain UA and urine culture. These were obtained after ABX started  -antibiotics as bove.   Elevated creatinine: 1.35 on admission. Baseline 1.1-1.2.  - am BMP - Encourage oral fluid  Cough: Doubt cardiac or pulmonary process with isolated cough. No fever, chest pain or sign of fluid overload. CXR normal two days ago. Saturation in 90's.  - will monitor  Hypertension: hypertensive on arrival. On losartan and Metoprolol XL at home. Didn't take her meds today -continue home losartan and metoprolol XL  CAD: history of CAD s/p stent placement in 2008 per patient. No echo in chart. No chest pain or shortness of breath now -continue home meds (Losartana and metoprolol XL) -outpatient stress test  -Lipid panel -Atorvastatin 40 mg here (on lovastatin at home)  Hyperglycemia/DM-2: poorly controlled. A1c 15 in 08/2014. BG 500 on arrival. Not in DKA or HSS. Didn't take her Lantus today. Takes 40 units twice a day also it says 90 units at bedtime in epic. Also on Novolog for meal coverage. A1c 15 a year ago. -Lantus 45 units at bedtime -SSI-moderate sensititivity -A1c -ACHS CBG -Gabapentin as above  FEN/GI:  Heart healthy and carb modified Saline lock  Prophylaxis: Lovenox  Disposition: admit to regular floor  History of Present Illness:  Natasha Sexton is a 82 y.o. female presenting with leg ulcer, cough and hallucination.   Patient reports pulling a worm out of her mouth and has to come in to be evaluated. She says this has been going on for two days. She says the worms look like tapeworm. She also says her care giver saw her leg told her to go to ED. So, her daughter and son-in-law dropped  her in ED. When I talked to family over the phone, they say she has had visual hallucination about worm infestation for two weeks. They were also concerned about her leg infection and UTI. They think the infections might be contributing to her hallucination. She doesn't have  psychiatric history. They say she doesn't let them help her with her medication, not sure if she is taking them appropriately. She has no good vision either but she can get around in her house and takes care of her ADLs. Patient reports hitting her left leg against bed rail and injuring her leg about 3-4 weeks ago. Her daughter says, the injury actually happened about two months ago. She reports taking antibiotics for two weeks at that time. She doesn't remember the name of the antibiotics.  She reports cough for two weeks as well. Cough is productive with white-greyish sputum. She denies hemoptysis. CXR was negative two days ago. She denies fever, chest pain and shortness of breath. She also reports "diarrhea" especially after meals. She says she gets abdominal cramps after meals followed by loose stool twice a day. She also have dysuria that has been going on for over a week. She presented to ED on 4/11. UA was remarkable for many bacteria, moderate LE, glucose >1000, protien >300 and negative ketones. She was prescribed keflex and dischraged home. She reports taking this at home.   She denies smoking, EtOH and drug use. Lives with daughter and son-in-law  Chart review:  -11/30/2014: fall. CT head negative  -06/19/2015: left leg injury-Augmentin  -08/14/2015: presented to ED with complaint of "sensation of worms in her mouth for the past year". UA dirty and discharged on cephalexin. CXR negative  ED course: vital signs sig for hypertension. Na 132, Cr 1.35, glucose 500, WBC 7.5, Hgb 10, Leg X-ray normal. Received vanc once. UA and blood culture not collected.   Review Of Systems: Per HPI Otherwise the remainder of the systems were negative.  Patient Active Problem List   Diagnosis Date Noted  . Acute kidney injury ([place]) 08/27/2014  . Hyperglycemia due to type 2 diabetes mellitus ([place]) 08/27/2014  . Uncontrolled type 2 diabetes with retinopathy ([place]) 08/27/2014  . Glaucoma 08/27/2014  . Blind  right eye 08/27/2014  . Dehydration 08/27/2014  . Acute encephalopathy 08/27/2014  . Essential hypertension 08/27/2014  . Hyperglycemia 08/25/2014    Past Medical History: Past Medical History  Diagnosis Date  . Diabetes mellitus without complication ([place])   . Hypertension   . Coronary artery disease   . Arthritis   . Glaucoma   . Cancer [facility]) 2005    Breast    Past Surgical History: Past Surgical History  Procedure Laterality Date  . Cholecystectomy      Social History: Social History  Substance Use Topics  . Smoking status: Never Smoker   . Smokeless tobacco: None  . Alcohol Use: No   Additional social history: Please also refer to relevant sections of EMR.  Family History: Family History  Problem Relation Age of Onset  . Stroke Father   . Diabetes Mother    Allergies and Medications: Allergies  Allergen Reactions  . Ivp Dye [Iodinated Diagnostic Agents]     Rash    No current facility-administered medications on file prior to encounter.   Current Outpatient Prescriptions on File Prior to Encounter  Medication Sig Dispense Refill  . cephALEXin (KEFLEX) 500 MG capsule Take 1 capsule (500 mg total) by mouth 2 (two) times daily. 14 capsule 0  .  gabapentin (NEURONTIN) 600 MG tablet Take 600 mg by mouth 3 (three) times daily.  1  . insulin aspart (NOVOLOG) 100 UNIT/ML FlexPen Inject 12 Units into the skin 3 (three) times daily with meals. 15 mL 3  . LANTUS SOLOSTAR 100 UNIT/ML Solostar Pen Inject 90 Units into the skin at bedtime. 15 mL 1  . losartan (COZAAR) 50 MG tablet Take 2 tablets (100 mg total) by mouth daily. 30 tablet 0  . lovastatin (MEVACOR) 40 MG tablet Take 40 mg by mouth at bedtime.  1  . metoprolol succinate (TOPROL-XL) 50 MG 24 hr tablet Take 50 mg by mouth daily. Take with or immediately following a meal.    . omega-3 acid ethyl esters (LOVAZA) 1 G capsule Take 4 g by mouth daily.   11  . timolol (TIMOPTIC) 0.5 % ophthalmic solution Place 1  drop into the left eye every morning. Use the same time every morning.  5  . traMADol (ULTRAM) 50 MG tablet Take 1 tablet (50 mg total) by mouth 3 (three) times daily as needed for moderate pain (leg pains). 30 tablet 0  . Travoprost, BAK Free, (TRAVATAN) 0.004 % SOLN ophthalmic solution Place 1 drop into both eyes at bedtime.     . B-D ULTRAFINE III SHORT PEN 31G X 8 MM MISC See admin instructions.  1  . NOVOFINE PLUS 32G X 4 MM MISC   6    Objective: BP 171/80 mmHg  Pulse 71  Temp(Src) 97.7 F (36.5 C) (Oral)  Resp 15  Ht _0  (1.626 m)  Wt 199 lb 8 oz (90.493 kg)  BMI 34.23 kg/m2  SpO2 99% Exam: Gen: appears well, ? Hallucination Nares: clear, no erythema, swelling or congestion Oropharynx: clear, moist CV: regular rate and rythm. S1 & S2 audible, no murmurs. Resp: no apparent work of breathing, clear to auscultation bilaterally. GI: bowel sounds normal, no tenderness to palpation, no rebound or guarding, no mass.  GU: no suprapubic tenderness, no CVA tendereness Skin: ulcer over the lateral aspect of her lower third of her left leg about 2 inches in diameter with surrounding erythematous skin. The surrounding skin feels warmer to touch. No sign of underlying fluid loculation or drainable abscess MSK:  Neuro: alert, oriented Speech: normal Cranial nerve: CN II-XII grossly intact  Sensory: markedly reduced sensation over low extremities to mid-shin bilaterally       Labs and Imaging: CBC BMET   Recent Labs Lab 08/16/15 1238  WBC 7.5  HGB 10.1*  HCT 30.6*  PLT 247    Recent Labs Lab 08/16/15 1238  NA 132*  K 4.5  CL 98*  CO2 22  BUN 35*  CREATININE 1.35*  GLUCOSE 496*  CALCIUM 9.2      Mercy Riding, MD 08/16/2015, 5:06 PM PGY-1, [facility] Intern pager: [phone], text pages welcome  Upper Level Addendum:  I have seen and evaluated this patient along with Dr. Cyndia Skeeters and reviewed the above note, making necessary revisions in  [facility].   Clearance Coots, MD Family Medicine PGY-3

## 2015-08-16 NOTE — ED Notes (Signed)
MD at bedside. 

## 2015-08-17 DIAGNOSIS — F22 Delusional disorders: Secondary | ICD-10-CM | POA: Diagnosis not present

## 2015-08-17 DIAGNOSIS — L0390 Cellulitis, unspecified: Secondary | ICD-10-CM | POA: Diagnosis not present

## 2015-08-17 DIAGNOSIS — Z794 Long term (current) use of insulin: Secondary | ICD-10-CM | POA: Diagnosis not present

## 2015-08-17 DIAGNOSIS — E1149 Type 2 diabetes mellitus with other diabetic neurological complication: Secondary | ICD-10-CM | POA: Insufficient documentation

## 2015-08-17 DIAGNOSIS — R441 Visual hallucinations: Secondary | ICD-10-CM | POA: Diagnosis present

## 2015-08-17 DIAGNOSIS — E11622 Type 2 diabetes mellitus with other skin ulcer: Secondary | ICD-10-CM | POA: Diagnosis present

## 2015-08-17 DIAGNOSIS — Z91041 Radiographic dye allergy status: Secondary | ICD-10-CM | POA: Diagnosis not present

## 2015-08-17 DIAGNOSIS — E11319 Type 2 diabetes mellitus with unspecified diabetic retinopathy without macular edema: Secondary | ICD-10-CM | POA: Diagnosis present

## 2015-08-17 DIAGNOSIS — I2510 Atherosclerotic heart disease of native coronary artery without angina pectoris: Secondary | ICD-10-CM | POA: Diagnosis present

## 2015-08-17 DIAGNOSIS — I10 Essential (primary) hypertension: Secondary | ICD-10-CM | POA: Diagnosis present

## 2015-08-17 DIAGNOSIS — Z79899 Other long term (current) drug therapy: Secondary | ICD-10-CM | POA: Diagnosis not present

## 2015-08-17 DIAGNOSIS — B9620 Unspecified Escherichia coli [E. coli] as the cause of diseases classified elsewhere: Secondary | ICD-10-CM | POA: Diagnosis present

## 2015-08-17 DIAGNOSIS — E1142 Type 2 diabetes mellitus with diabetic polyneuropathy: Secondary | ICD-10-CM | POA: Diagnosis present

## 2015-08-17 DIAGNOSIS — E1165 Type 2 diabetes mellitus with hyperglycemia: Secondary | ICD-10-CM | POA: Diagnosis present

## 2015-08-17 DIAGNOSIS — N390 Urinary tract infection, site not specified: Secondary | ICD-10-CM | POA: Diagnosis present

## 2015-08-17 DIAGNOSIS — Z833 Family history of diabetes mellitus: Secondary | ICD-10-CM | POA: Diagnosis not present

## 2015-08-17 DIAGNOSIS — L03116 Cellulitis of left lower limb: Secondary | ICD-10-CM | POA: Diagnosis present

## 2015-08-17 DIAGNOSIS — Z853 Personal history of malignant neoplasm of breast: Secondary | ICD-10-CM | POA: Diagnosis not present

## 2015-08-17 DIAGNOSIS — Z955 Presence of coronary angioplasty implant and graft: Secondary | ICD-10-CM | POA: Diagnosis not present

## 2015-08-17 DIAGNOSIS — L97929 Non-pressure chronic ulcer of unspecified part of left lower leg with unspecified severity: Secondary | ICD-10-CM | POA: Diagnosis present

## 2015-08-17 DIAGNOSIS — H5441 Blindness, right eye, normal vision left eye: Secondary | ICD-10-CM | POA: Diagnosis present

## 2015-08-17 DIAGNOSIS — R739 Hyperglycemia, unspecified: Secondary | ICD-10-CM | POA: Diagnosis present

## 2015-08-17 LAB — CBC
HEMATOCRIT: 28.1 % — AB (ref 36.0–46.0)
HEMOGLOBIN: 9.1 g/dL — AB (ref 12.0–15.0)
MCH: 29.7 pg (ref 26.0–34.0)
MCHC: 32.4 g/dL (ref 30.0–36.0)
MCV: 91.8 fL (ref 78.0–100.0)
Platelets: 230 10*3/uL (ref 150–400)
RBC: 3.06 MIL/uL — AB (ref 3.87–5.11)
RDW: 12.9 % (ref 11.5–15.5)
WBC: 6.5 10*3/uL (ref 4.0–10.5)

## 2015-08-17 LAB — BASIC METABOLIC PANEL
ANION GAP: 10 (ref 5–15)
BUN: 23 mg/dL — ABNORMAL HIGH (ref 6–20)
CALCIUM: 8.7 mg/dL — AB (ref 8.9–10.3)
CO2: 23 mmol/L (ref 22–32)
Chloride: 105 mmol/L (ref 101–111)
Creatinine, Ser: 1.1 mg/dL — ABNORMAL HIGH (ref 0.44–1.00)
GFR, EST AFRICAN AMERICAN: 53 mL/min — AB (ref >60)
GFR, EST NON AFRICAN AMERICAN: 46 mL/min — AB (ref >60)
GLUCOSE: 201 mg/dL — AB (ref 65–99)
POTASSIUM: 3.7 mmol/L (ref 3.5–5.1)
Sodium: 138 mmol/L (ref 135–145)

## 2015-08-17 LAB — URINE MICROSCOPIC-ADD ON

## 2015-08-17 LAB — URINALYSIS, ROUTINE W REFLEX MICROSCOPIC
BILIRUBIN URINE: NEGATIVE
GLUCOSE, UA: 500 mg/dL — AB
Ketones, ur: NEGATIVE mg/dL
Leukocytes, UA: NEGATIVE
Nitrite: NEGATIVE
PROTEIN: 100 mg/dL — AB
Specific Gravity, Urine: 1.011 (ref 1.005–1.030)
pH: 6 (ref 5.0–8.0)

## 2015-08-17 LAB — GLUCOSE, CAPILLARY
GLUCOSE-CAPILLARY: 162 mg/dL — AB (ref 65–99)
GLUCOSE-CAPILLARY: 217 mg/dL — AB (ref 65–99)
GLUCOSE-CAPILLARY: 216 mg/dL — AB (ref 65–99)
Glucose-Capillary: 287 mg/dL — ABNORMAL HIGH (ref 65–99)

## 2015-08-17 LAB — HEMOGLOBIN A1C
HEMOGLOBIN A1C: 10.9 % — AB (ref 4.8–5.6)
MEAN PLASMA GLUCOSE: 266 mg/dL

## 2015-08-17 LAB — RPR: RPR Ser Ql: NONREACTIVE

## 2015-08-17 MED ORDER — GABAPENTIN 100 MG PO CAPS
100.0000 mg | ORAL_CAPSULE | Freq: Three times a day (TID) | ORAL | Status: DC
  Administered 2015-08-17 – 2015-08-18 (×6): 100 mg via ORAL
  Filled 2015-08-17 (×6): qty 1

## 2015-08-17 MED ORDER — MAGIC MOUTHWASH
1.0000 mL | Freq: Three times a day (TID) | ORAL | Status: DC | PRN
  Administered 2015-08-18: 1 mL via ORAL
  Filled 2015-08-17: qty 5

## 2015-08-17 MED ORDER — MAGIC MOUTHWASH: 1.0000 mL | Freq: Four times a day (QID) | ORAL | Status: DC

## 2015-08-17 MED ORDER — COLLAGENASE 250 UNIT/GM EX OINT
TOPICAL_OINTMENT | Freq: Every day | CUTANEOUS | Status: DC
  Administered 2015-08-17 – 2015-08-20 (×4): via TOPICAL
  Filled 2015-08-17: qty 30

## 2015-08-17 MED ORDER — PIPERACILLIN-TAZOBACTAM 3.375 G IVPB
3.3750 g | Freq: Three times a day (TID) | INTRAVENOUS | Status: DC
  Administered 2015-08-17 – 2015-08-19 (×6): 3.375 g via INTRAVENOUS
  Filled 2015-08-17 (×8): qty 50

## 2015-08-17 MED ORDER — GUAIFENESIN-DM 100-10 MG/5ML PO SYRP: 5.0000 mL | ORAL_SOLUTION | ORAL | Status: DC | PRN

## 2015-08-17 NOTE — Progress Notes (Signed)
Family Medicine Teaching Service Daily Progress Note Intern Pager: [phone]  Patient name: Natasha Sexton Medical record number: [id number] Date of birth: [birth year] Age: 82 y.o. Gender: female  Primary Care Provider: Helane Rima, MD Consultants: none Code Status: FULL  Pt Overview and Major Events to Date:  4/13 - admitted to [place]; started on vanc/zosyn  Assessment and Plan: Natasha Sexton is a 82 y.o. female presenting with leg ulcer & visual hallucinations. PMH is significant for DM-2, HTN, CAD, diabetic neuropathy.  Cellulitic ulcer in diabetic patients: after traumatic injury ~1 mo ago. No signs of systemic infection. Patient without fever or leukocytosis. Exam remarkable for ulcer over lateral aspect of lower L leg ~2 in in diameter with surrounding erythema and warmth. No fluctuance. WBC 7.5, LA WNL on admission. Sed rate and CRP elevated. Wound and blood cx collected, however blood cx obtained after abx started. LLE x-ray normal. Doubt osteomyelitis but cannot rule out. LE>RE likely due to cellulitis than DVT. WBC still WNL at 6.7 this AM. Seen by wound care yesterday, who recommended follow-up at outpatient wound care center for eventual debridement.  - Vanc and zosyn (4/13>>) in diabetic patient - F/u wound, blood culture - Can consider MRI to rule out osteo if not improving - Tylenol PRN pain - Per wound care, place order for continued wound care with eventual debridement at outpatient center prior to d/c  Visual hallucination (delusional parasitosis): Believes worms are coming out of her mouth. Ongoing for past two wks per family. No prior psychiatric history. Negative head CT in 11/2014. Ddx includes delirium, dementia & psychosis. Responds to questions appropriately. Vit B12, TSH WNL; RPR neg. Hallucinations resolved yesterday, but have returned this AM.  - Consider mini cog   UTI: Endorses dysuria. Pyelo less likely without systemic symptoms and CVA tenderness on  exam. On Keflex as outpatient (4/11 to admission on 4/13). Unclear if she was taking it. UA on admission with small hemoglobin, but no nitrites or leukocytes (improvement over UA obtained two days prior to presentation).  - F/u urine culture (obtained after abx initiated) - NGx1d - Cont vanc/zosyn    Elevated creatinine: 1.35 on admission. Baseline 1.1-1.2. 1.37 this AM.  - Encourage PO intake   Cough: Doubt cardiac or pulmonary process with isolated cough. No fever, chest pain or sign of fluid overload. CXR normal 2d prior to admission. O2 sat in high 90s on RA.  - Cont to monitor  Hypertension: hypertensive on arrival (max 195/82), but had not taken meds that day. On losartan and Metoprolol XL at home. BP 140/62, 140/59 overnight.  -Continue home losartan and metoprolol XL  CAD: s/p stent placement in 2008 per patient. No echo in chart. On lovastatin at home. Currently asymptomatic. Triglycerides elevated at 591 with decreased HDL of 33.  - Continue metoprolol - Atorvastatin 40 mg  - Recommend outpatient stress test after d/c  Hyperglycemia/DM-2: Poorly controlled. A1c 15 in 08/2014; improved to 10.9 on admission. BG 500 on arrival, but missed dose of Lantus. Not in DKA or HSS. Reports taking 40U BID, but prior records report 90U at bedtime. Also on Novolog for meal coverage. CBG 287 overnight.  - Lantus 45U at bedtime  - SSI-moderate sensititivity - ACHS CBG - Cont gabapentin  Diabetic neuropathy: Markedly reduced sensation in bilateral LE. On gabapentin 600mg  TID at home, but not complaining of parasthesias. Gabapentin could be contributing to mental status changes.  - Decrease gabapentin to 100mg  TID given potential for sedation/mental status change - Monitor  for parasthesias and resume gabapentin at lower dose if necessary  FEN/GI: Heart healthy and carb modified, saline lock Prophylaxis: Lovenox  Disposition: pending medical improvement  Subjective:  Patient complaining of  generalized pain overnight, but localizes pain to legs, specifically site of ulceration, this AM. Also endorses sensation of something in her mouth, although she says she knows there is nothing there.   Objective: Temp:  [98.1 F (36.7 C)-98.3 F (36.8 C)] 98.1 F (36.7 C) (04/15 0535) Pulse Rate:  [68-75] 71 (04/15 0535) Resp:  [18] 18 (04/15 0535) BP: (139-140)/(58-62) 140/59 mmHg (04/15 0535) SpO2:  [100 %] 100 % (04/15 0535) Physical Exam: General: sitting up in bed; in NAD Cardiovascular: RRR, no murmurs appreciated Respiratory: CTAB, no wheezes, normal work of breathing on RA Abdomen: soft, non-tender, non-distended, +BS Extremities: ulcerated lesion ~5x3cm at widest point with surrounding erythema, non-tender to palpation Neuro: reduced sensation to touch in bilateral LE; A&Ox4, no focal deficits   Laboratory:  Recent Labs Lab 08/16/15 1238 08/17/15 0438 08/18/15 0517  WBC 7.5 6.5 6.7  HGB 10.1* 9.1* 8.9*  HCT 30.6* 28.1* 27.4*  PLT 247 230 207    Recent Labs Lab 08/14/15 1624 08/16/15 1238 08/17/15 0438 08/18/15 0517  NA 130* 132* 138 139  K 4.4 4.5 3.7 3.7  CL 99* 98* 105 106  CO2 24 22 23 24   BUN 37* 35* 23* 22*  CREATININE 1.29* 1.35* 1.10* 1.37*  CALCIUM 8.8* 9.2 8.7* 8.7*  PROT 7.3 7.7  --   --   BILITOT 0.6 0.6  --   --   ALKPHOS 87 89  --   --   ALT 25 26  --   --   AST 58* 36  --   --   GLUCOSE 379* 496* 201* 245*    Imaging/Diagnostic Tests: Dg Tibia/fibula Left 08/16/2015  CLINICAL DATA:  Infected wound in left lower leg. EXAM: LEFT TIBIA AND FIBULA - 2 VIEW. IMPRESSION: No significant abnormality seen involving the left tibia or fibula.  Verner Mould, MD 08/18/2015, 7:16 AM PGY-1, [place] Intern pager: [phone], text pages welcome

## 2015-08-17 NOTE — Care Management Obs Status (Signed)
[place] NOTIFICATION   Patient Details  Name: Korrin Habegger MRN: [id number] Date of Birth: 1932-10-08   Medicare Observation Status Notification Given:  Yes    Carles Collet, RN 08/17/2015, 10:56 AM

## 2015-08-17 NOTE — Discharge Summary (Signed)
[facility] Discharge Summary  Patient name: Natasha Sexton Medical record number: [id number] Date of birth: 23-Sep-1932 Age: 82 y.o. Gender: female Date of Admission: 08/16/2015  Date of Discharge: 08/20/2015 Admitting Physician: Zenia Resides, MD  Primary Care Provider: Helane Rima, MD Consultants: none  Indication for Hospitalization: hallucinations, cellulitis  Discharge Diagnoses/Problem List:  Patient Active Problem List   Diagnosis Date Noted  . Wound cellulitis   . Delusion of infestation ([place])   . Diabetes mellitus type 2 with neurological manifestations ([place])   . Cellulitis 08/16/2015  . Acute kidney injury ([place]) 08/27/2014  . Hyperglycemia due to type 2 diabetes mellitus ([place]) 08/27/2014  . Uncontrolled type 2 diabetes with retinopathy ([place]) 08/27/2014  . Glaucoma 08/27/2014  . 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 Dehydration 08/27/2014  . Acute encephalopathy 08/27/2014  . Essential hypertension 08/27/2014  . Hyperglycemia 08/25/2014   Disposition: home  Discharge Condition: stable  Discharge Exam:  General: resting comfortably in bed; in NAD Cardiovascular: RRR, no murmurs appreciated Respiratory: CTAB, no wheezes, normal work of breathing on RA Abdomen: soft, non-tender, non-distended, +BS Extremities: ulcerated lesion ~5x3cm at widest point, non-tender to palpation, erythema has now resolved Neuro: reduced sensation to touch in bilateral LE; A&Ox4, no focal deficits   Brief Hospital Course:  Patient presented with hallucinations and cellulitis ulcer on L. Was also found to have UTI on admission.   Visual hallucinations (delusional parasitosis) Patient complaining of worms in her mouth. Per patient report, this had been going on for two days prior to presentation. She actually presented to the ED when the hallucinations began, was diagnosed with UTI in ED, and sent home with antibiotics. Her family reported that patient had  been complaining of "worm infestation" for two weeks prior to admission. Patient with no psychiatric history. Prior to admission, she was diagnosed with UTI and prescribed Keflex, though it is unclear whether patient was actually taking meds or not. Hallucinations initially thought 2/2 to infection (cellulitis and/or UTI), however did not resolve with treatment of infections. Patient still endorsing the feeling of worms or threads in her mouth at time of discharge, however she understands that there is not actually anything in her mouth, and remained completely oriented throughout entirety of admission.  Prior to discharge, mini-mental status exam was performed. Patient scored 27/30 (was only unable to recall three unrelated items after performing another task.)  Cellulitis of LLE Patient reports hitting her leg on a bed rail 3-4 weeks prior to admission (daughter reports 2 months prior to admission). She was prescribed two weeks of Augmentin prior to presentation, however it seems there was minimal to no improvement in symptoms. Plain film obtained in ED showed no signs of cellulitis, however patient was started on vanc and zosyn. Wound and blood cultures were obtained, and wound care was consulted. Cultures grew Enterobacter and Staph aureus. Patient was transitioned to Bactrim, and will follow-up with outpatient wound care for continued management of the wound.    UTI Patient was diagnosed with UTI two days prior to admission when she initially presented to ED and was started on Keflex. It was unclear whether she actually took the medication after it was prescribed. She endorsed dysuria on admission, but denies fevers/chills or CVA tenderness on exam, making pyelo less likely. UA on admission showed small hemoglobin as well as protein, but no nitrites or leukocytes (an improvement over UA obtained two days prior. Patient was started on zosyn and vanc, though primarily for cellulitis. Symptoms  improved  throughout admission, with eventual resolution of dysuria by discharge. Urine culture grew pan-sensitive E.coli. Patient was transitioned to Bactrim prior to discharge.   Issues for Follow Up:  1. Patient discharged with five days of Bactrim to complete 10 day course of treatment for both cellulitis and UTI (last dose 4/22).  2. Please ensure patient schedules appointment with wound care center. Per inpatient wound care team, patient will likely need eventual sharp debridement at outpatient center.   Significant Procedures: none  Significant Labs and Imaging:   Recent Labs Lab 08/17/15 0438 08/18/15 0517 08/19/15 0520  WBC 6.5 6.7 8.1  HGB 9.1* 8.9* 9.3*  HCT 28.1* 27.4* 28.4*  PLT 230 207 230    Recent Labs Lab 08/14/15 1624 08/16/15 1238 08/17/15 0438 08/18/15 0517 08/19/15 0520  NA 130* 132* 138 139 140  K 4.4 4.5 3.7 3.7 3.4*  CL 99* 98* 105 106 107  CO2 24 22 23 24 24   GLUCOSE 379* 496* 201* 245* 125*  BUN 37* 35* 23* 22* 16  CREATININE 1.29* 1.35* 1.10* 1.37* 1.22*  CALCIUM 8.8* 9.2 8.7* 8.7* 8.9  ALKPHOS 87 89  --   --   --   AST 58* 36  --   --   --   ALT 25 26  --   --   --   ALBUMIN 3.3* 3.3*  --   --   --     Results/Tests Pending at Time of Discharge: none  Discharge Medications:    Medication List    STOP taking these medications        cephALEXin 500 MG capsule  Commonly known as:  KEFLEX     gabapentin 600 MG tablet  Commonly known as:  NEURONTIN      TAKE these medications        B-D ULTRAFINE III SHORT PEN 31G X 8 MM Misc  Generic drug:  Insulin Pen Needle  See admin instructions.     NOVOFINE PLUS 32G X 4 MM Misc  Generic drug:  Insulin Pen Needle     insulin aspart 100 UNIT/ML FlexPen  Commonly known as:  NOVOLOG  Inject 12 Units into the skin 3 (three) times daily with meals.     LANTUS SOLOSTAR 100 UNIT/ML Solostar Pen  Generic drug:  Insulin Glargine  Inject 90 Units into the skin at bedtime.     losartan 50 MG tablet   Commonly known as:  COZAAR  Take 2 tablets (100 mg total) by mouth daily.     lovastatin 40 MG tablet  Commonly known as:  MEVACOR  Take 40 mg by mouth at bedtime.     metoprolol succinate 50 MG 24 hr tablet  Commonly known as:  TOPROL-XL  Take 50 mg by mouth daily. Take with or immediately following a meal.     omega-3 acid ethyl esters 1 g capsule  Commonly known as:  LOVAZA  Take 4 g by mouth daily.     sulfamethoxazole-trimethoprim 400-80 MG tablet  Commonly known as:  BACTRIM,SEPTRA  Take 2 tablets by mouth every 12 (twelve) hours.     timolol 0.5 % ophthalmic solution  Commonly known as:  TIMOPTIC  Place 1 drop into the left eye every morning. Use the same time every morning.     traMADol 50 MG tablet  Commonly known as:  ULTRAM  Take 1 tablet (50 mg total) by mouth 3 (three) times daily as needed for moderate pain (leg pains).  Travoprost (BAK Free) 0.004 % Soln ophthalmic solution  Commonly known as:  TRAVATAN  Place 1 drop into both eyes at bedtime.        Discharge Instructions: Please refer to Patient Instructions section of EMR for full details.  Patient was counseled important signs and symptoms that should prompt return to medical care, changes in medications, dietary instructions, activity restrictions, and follow up appointments.   Follow-Up Appointments: Follow-up Information    Schedule an appointment as soon as possible for a visit with [facility]             .   Contact information:   [address] [phone]      Follow up with Helane Rima, MD. Schedule an appointment as soon as possible for a visit in 1 week.   Specialty:  Family Medicine   Why:  For hospital follow-up   Contact information:   [place] [phone] [place], MD 08/20/2015, 3:46 PM PGY-1, [place]

## 2015-08-17 NOTE — Care Management Note (Addendum)
Case Management Note  Patient Details  Name: Natasha Sexton MRN: [id number] Date of Birth: 28-Dec-1932  Subjective/Objective:                 Spoke with patient at the bedside. She descibed living with her daughter and son in law. She could describe the location of her MD office and pharmacy and denied any difficulties getting to either or paying for her medication. She states that she would be interested in some [facility] PT and RN. MD at bedside and placed PT eval order. Patient does have rolator from home in room. Denied any DME needs. Patient admitted with cellulitis and is receiving IV abx. Spoke with daughter and son in law, they ststed patient had [facility] in the past for [facility] PT, and would like to use them again after discharge. Referral made to [facility].   Action/Plan:  CM will continue to follow for choice for [facility] and discharge planning.   Expected Discharge Date:   (Pending)               Expected Discharge Plan:  [place]  In-House Referral:     Discharge planning Services  CM Consult  Post Acute Care Choice:    Choice offered to:     DME Arranged:    DME Agency:     HH Arranged:    HH Agency:     Status of Service:  In process, will continue to follow  Medicare Important Message Given:    Date Medicare IM Given:    Medicare IM give by:    Date Additional Medicare IM Given:    Additional Medicare Important Message give by:     If discussed at [place] of Stay Meetings, dates discussed:    Additional Comments:  Carles Collet, RN 08/17/2015, 11:18 AM

## 2015-08-17 NOTE — Evaluation (Signed)
Physical Therapy Evaluation Patient Details Name: Natasha Sexton MRN: [id number] DOB: [birth year] Today's Date: 08/17/2015   History of Present Illness  Natasha Sexton is a 82 y.o. female presenting with leg ulcer & visual hallucination. PMH is significant for DM-2, HTN, CAD, diabetic neuropathy.    Clinical Impression  Pt admitted with above diagnosis. Pt currently with functional limitations due to the deficits listed below (see PT Problem List). Pt very impulsive with functional mobility (frequently left her walker behind), has mild cognitive impairment, and has poor balance which makes her high risk for falls. Pt will benefit from skilled PT to increase their independence and safety with mobility to allow discharge back home with her daughter. Pt would benefit from HHPT to increase independence with functional mobility and decrease fall risk.     Follow Up Recommendations Home health PT;Supervision/Assistance - 24 hour    Equipment Recommendations  None recommended by PT    Recommendations for Other Services       Precautions / Restrictions Precautions Precautions: None Restrictions Weight Bearing Restrictions: No      Mobility  Bed Mobility Overal bed mobility: Modified Independent             General bed mobility comments: independent with use of rail   Transfers Overall transfer level: Needs assistance Equipment used: 4-wheeled walker Transfers: Sit to/from Stand Sit to Stand: Min guard         General transfer comment: Somewhat impulsive, VC's for correct use of walker. Min guard for safety and balance. Able to stand from low commode but needed heavy UE support.   Ambulation/Gait Ambulation/Gait assistance: Min guard Ambulation Distance (Feet): 100 Feet Assistive device: 4-wheeled walker Gait Pattern/deviations: Step-through pattern;Decreased stride length;Drifts right/left;Trunk flexed;Wide base of support Gait velocity: slow   General Gait  Details: Pt has very impulsive unsteady gait with use of rollator. She walks far behind the rollator and needs VC's for correct use and maneuvering around obstacles.   Stairs            Wheelchair Mobility    Modified Rankin (Stroke Patients Only)       Balance Overall balance assessment: Needs assistance Sitting-balance support: No upper extremity supported;Feet supported Sitting balance-Leahy Scale: Good     Standing balance support: Bilateral upper extremity supported Standing balance-Leahy Scale: Poor Standing balance comment: Reliant on UE support.                              Pertinent Vitals/Pain Pain Assessment: No/denies pain  Vitals stable throughout activity on room air.     Home Living Family/patient expects to be discharged to:: Private residence Living Arrangements: Children Available Help at Discharge: Family;Available 24 hours/day Type of Home: House Home Access: Level entry     Home Layout: One level Home Equipment: Walker - 4 wheels;Shower seat      Prior Function Level of Independence: Needs assistance   Gait / Transfers Assistance Needed: Ambulates with rollator.   ADL's / Homemaking Assistance Needed: Pt has aide that comes in 5x as week to help her bathe, get dressed, and gives medications.        Hand Dominance   Dominant Hand: Right    Extremity/Trunk Assessment   Upper Extremity Assessment: Generalized weakness           Lower Extremity Assessment: Generalized weakness      Cervical / Trunk Assessment: Kyphotic  Communication   Communication: HOH  Cognition  Arousal/Alertness: Awake/alert Behavior During Therapy: WFL for tasks assessed/performed Overall Cognitive Status: History of cognitive impairments - at baseline       Memory: Decreased short-term memory;Decreased recall of precautions              General Comments General comments (skin integrity, edema, etc.): Pt pleasant and cooperative  with therapy but somewhat impulsive.  Pt somewhat incontinent with urine and was in a hurry to get to the bathroom which made her very unsafe and needed a lot of VC's. She just left her walker to get into the bathroom.     Exercises        Assessment/Plan    PT Assessment Patient needs continued PT services  PT Diagnosis Difficulty walking;Abnormality of gait;Generalized weakness   PT Problem List Decreased strength;Decreased activity tolerance;Decreased balance;Decreased coordination;Decreased cognition;Decreased knowledge of use of DME;Decreased safety awareness  PT Treatment Interventions DME instruction;Gait training;Functional mobility training;Therapeutic activities;Therapeutic exercise;Balance training;Patient/family education   PT Goals (Current goals can be found in the Care Plan section) Acute Rehab PT Goals Patient Stated Goal: to go home and get HHPT PT Goal Formulation: With patient Time For Goal Achievement: 08/31/15 Potential to Achieve Goals: Good    Frequency Min 3X/week   Barriers to discharge        Co-evaluation               End of Session Equipment Utilized During Treatment: Gait belt Activity Tolerance: Patient tolerated treatment well Patient left: in chair;with call bell/phone within reach;with chair alarm set Nurse Communication: Mobility status    Functional Assessment Tool Used: clinical judgment Functional Limitation: Mobility: Walking and moving around Mobility: Walking and Moving Around Current Status [phone]): At least 20 percent but less than 40 percent impaired, limited or restricted Mobility: Walking and Moving Around Goal Status [phone]): At least 1 percent but less than 20 percent impaired, limited or restricted    Time: [id number] PT Time Calculation (min) (ACUTE ONLY): 16 min   Charges:   PT Evaluation $PT Eval Moderate Complexity: 1 Procedure     PT G Codes:   PT G-Codes **NOT FOR INPATIENT CLASS** Functional Assessment Tool  Used: clinical judgment Functional Limitation: Mobility: Walking and moving around Mobility: Walking and Moving Around Current Status [id number]): At least 20 percent but less than 40 percent impaired, limited or restricted Mobility: Walking and Moving Around Goal Status [phone]): At least 1 percent but less than 20 percent impaired, limited or restricted   [place], SPT [place] 08/17/2015, 3:31 PM

## 2015-08-17 NOTE — Progress Notes (Signed)
Inpatient Diabetes Program Recommendations  AACE/ADA: New Consensus Statement on Inpatient Glycemic Control (2015)  Target Ranges:  Prepandial:   less than 140 mg/dL      Peak postprandial:   less than 180 mg/dL (1-2 hours)      Critically ill patients:  140 - 180 mg/dL   Review of Glycemic Control  Diabetes history: DM 2 Outpatient Diabetes medications: Lantus 90 units QHS, Novolog 12 units TID Current orders for Inpatient glycemic control: Lantus 45 units, Novolog Moderate + HS  Inpatient Diabetes Program Recommendations: Insulin - Meal Coverage: Patient takes Novolog 12 units TID at home for meal coverage. While inpatient, please consider Novolog 5 units TID meal coverage if patient is consuming at least 50% of meals.  A1c 10.9%  Thanks,  Tama Headings RN, MSN, [facility] Inpatient Diabetes Coordinator Team Pager [phone] (8a-5p)

## 2015-08-17 NOTE — Consult Note (Addendum)
WOC wound consult note Reason for Consult: Consult requested for left leg wound. Pt states she injured it last week and has not been using any topical treatment. Wound type: Full thickness traumatic wound to left outer calf Measurement: 5.5X2.2X.3cm Wound bed: 85% slough/eschar, 15% dark red and dry wound bed Drainage (amount, consistency, odor)  No odor, drainage, or fluctuance Periwound: Generalized erythremia and edema surrounding wound to 3 cm Dressing procedure/placement/frequency: Santyl ointment for enzamatic debridement of nonviable tissue.  Pt could benefit from follow-up at the outpatient wound care center for sharp debridement once the nonviable tissue has softened after discharge; please order if desired.   Please re-consult if further assistance is needed.  Thank-you,  Julien Girt MSN, [place], [place], [place], [place]

## 2015-08-17 NOTE — Progress Notes (Signed)
Inpatient Diabetes Program Recommendations  AACE/ADA: New Consensus Statement on Inpatient Glycemic Control (2015)  Target Ranges:  Prepandial:   less than 140 mg/dL      Peak postprandial:   less than 180 mg/dL (1-2 hours)      Critically ill patients:  140 - 180 mg/dL   Spoke with patient about diabetes and home regimen for diabetes control. Patient reports that she is followed by Dr. Lavone Neri, PCP for diabetes management. Patient reports recently seeing her PCP, which she just got set up with, and there were no adjustments to her insulin regimen at this time. Patient states she takes Lantus 45 units BID, and Novolog 10 units TID with meals. Patient states she does not cover her glucose when it is 150. Patient reports that she takes her insulin as prescribed. Patient reports that her glucose has not been controlled for awhile even after taking her insulin. Patient lives with daughter and son in law. Patient states sometimes her daughter helps her with her insulins. Spoke with patient about her A1c levels 10.9% and discussed A1c and glucose goals. Explained importance of glucose control on wound healing. Recommended to her to follow up or at least call her PCP for further glucose control. Also suggested to see what regimen she was on here and her PCP may can adjust accordingly. Stressed to the patient the importance of improving glycemic control to prevent further complications from uncontrolled diabetes. Patient reports sometimes not following a DM diet. Discussed impact of nutrition, exercise, stress, sickness, and medications on diabetes control.Patient verbalized understanding of information discussed and she states that she has no further questions at this time related to diabetes.  Thanks, Tama Headings RN, MSN, [facility] Inpatient Diabetes Coordinator Team Pager [phone] (8a-5p)

## 2015-08-18 LAB — GLUCOSE, CAPILLARY
GLUCOSE-CAPILLARY: 205 mg/dL — AB (ref 65–99)
GLUCOSE-CAPILLARY: 206 mg/dL — AB (ref 65–99)
Glucose-Capillary: 174 mg/dL — ABNORMAL HIGH (ref 65–99)
Glucose-Capillary: 202 mg/dL — ABNORMAL HIGH (ref 65–99)

## 2015-08-18 LAB — BASIC METABOLIC PANEL
ANION GAP: 9 (ref 5–15)
BUN: 22 mg/dL — AB (ref 6–20)
CO2: 24 mmol/L (ref 22–32)
Calcium: 8.7 mg/dL — ABNORMAL LOW (ref 8.9–10.3)
Chloride: 106 mmol/L (ref 101–111)
Creatinine, Ser: 1.37 mg/dL — ABNORMAL HIGH (ref 0.44–1.00)
GFR calc Af Amer: 41 mL/min — ABNORMAL LOW (ref >60)
GFR calc non Af Amer: 35 mL/min — ABNORMAL LOW (ref >60)
GLUCOSE: 245 mg/dL — AB (ref 65–99)
POTASSIUM: 3.7 mmol/L (ref 3.5–5.1)
Sodium: 139 mmol/L (ref 135–145)

## 2015-08-18 LAB — CBC
HEMATOCRIT: 27.4 % — AB (ref 36.0–46.0)
HEMOGLOBIN: 8.9 g/dL — AB (ref 12.0–15.0)
MCH: 30 pg (ref 26.0–34.0)
MCHC: 32.5 g/dL (ref 30.0–36.0)
MCV: 92.3 fL (ref 78.0–100.0)
Platelets: 207 10*3/uL (ref 150–400)
RBC: 2.97 MIL/uL — ABNORMAL LOW (ref 3.87–5.11)
RDW: 13.2 % (ref 11.5–15.5)
WBC: 6.7 10*3/uL (ref 4.0–10.5)

## 2015-08-18 LAB — VANCOMYCIN, TROUGH: VANCOMYCIN TR: 18 ug/mL (ref 10.0–20.0)

## 2015-08-18 MED ORDER — VANCOMYCIN HCL IN DEXTROSE 750-5 MG/150ML-% IV SOLN
750.0000 mg | INTRAVENOUS | Status: DC
  Filled 2015-08-18: qty 150

## 2015-08-18 MED ORDER — OXYCODONE-ACETAMINOPHEN 5-325 MG PO TABS
1.0000 | ORAL_TABLET | Freq: Once | ORAL | Status: AC | PRN
  Administered 2015-08-19: 1 via ORAL
  Filled 2015-08-18: qty 1

## 2015-08-18 NOTE — Progress Notes (Signed)
Patient complains of 10/10 pain. MD notified and advised to give tylenol and reassess need.  Will continue to monitor and follow MD orders.

## 2015-08-18 NOTE — Progress Notes (Signed)
Patient states that she wants to go home in the morning.  Patient was educated on the importance of staying in the hospital to finish her IV antibiotic therapy; however she said "I can give myself antibiotics at home."  Natasha Sexton said that she was upset that the doctors were not doing anything about the worms in her mouth.  She said that she thinks that it is a tape worm because it was very long when she pulled it out of her mouth at home.  She said "I see them when I rinse my mouth out with biotene, I see them moving.  I showed it to my doctor and she said that there was nothing there but of course she couldn't see them, they are so small."  The patient would talk about how big her worms were them mention that they were so small that they couldn't be seen.

## 2015-08-18 NOTE — Progress Notes (Signed)
Pharmacy Antibiotic Note  Natasha Sexton is a 82 y.o. female here with diarrhea. Pharmacy has been consulted to dose vancomycin and zosyn for cellulitis. Zosyn 3.375gm and Vancomycin 1gm have been ordered in the ED.   PM: Vanc trough came back at 18. Goal: 10-15 Plan:  -Decrease vancomycin 750mg  IV q24h -Zosyn 3.375gm IV q8h    Height: 5\' 4"  (162.6 cm) Weight: 199 lb 8 oz (90.493 kg) IBW/kg (Calculated) : 54.7  Temp (24hrs), Avg:97.7 F (36.5 C), Min:96.3 F (35.7 C), Max:98.3 F (36.8 C)   Recent Labs Lab 08/14/15 1612 08/14/15 1624 08/16/15 1238 08/16/15 1322 08/16/15 1618 08/17/15 0438 08/18/15 0517 08/18/15 2024  WBC 8.1  --  7.5  --   --  6.5 6.7  --   CREATININE  --  1.29* 1.35*  --   --  1.10* 1.37*  --   LATICACIDVEN  --   --   --  1.86 1.01  --   --   --   VANCOTROUGH  --   --   --   --   --   --   --  18    Estimated Creatinine Clearance: 35.7 mL/min (by C-G formula based on Cr of 1.37).    Allergies  Allergen Reactions  . Ivp Dye [Iodinated Diagnostic Agents]     Rash     Antimicrobials this admission: 4/13 zosyn>> 4/13 vanc>>  Microbiology results: 4/13 blood x2>>ngtd 4/13 urine>>canceled 4/13 wound>>mod GNR  Thank you for allowing pharmacy to be a part of this patient's care.

## 2015-08-19 LAB — BASIC METABOLIC PANEL
ANION GAP: 9 (ref 5–15)
BUN: 16 mg/dL (ref 6–20)
CHLORIDE: 107 mmol/L (ref 101–111)
CO2: 24 mmol/L (ref 22–32)
Calcium: 8.9 mg/dL (ref 8.9–10.3)
Creatinine, Ser: 1.22 mg/dL — ABNORMAL HIGH (ref 0.44–1.00)
GFR calc non Af Amer: 41 mL/min — ABNORMAL LOW (ref >60)
GFR, EST AFRICAN AMERICAN: 47 mL/min — AB (ref >60)
Glucose, Bld: 125 mg/dL — ABNORMAL HIGH (ref 65–99)
POTASSIUM: 3.4 mmol/L — AB (ref 3.5–5.1)
SODIUM: 140 mmol/L (ref 135–145)

## 2015-08-19 LAB — CBC
HCT: 28.4 % — ABNORMAL LOW (ref 36.0–46.0)
Hemoglobin: 9.3 g/dL — ABNORMAL LOW (ref 12.0–15.0)
MCH: 30.4 pg (ref 26.0–34.0)
MCHC: 32.7 g/dL (ref 30.0–36.0)
MCV: 92.8 fL (ref 78.0–100.0)
Platelets: 230 10*3/uL (ref 150–400)
RBC: 3.06 MIL/uL — AB (ref 3.87–5.11)
RDW: 13 % (ref 11.5–15.5)
WBC: 8.1 10*3/uL (ref 4.0–10.5)

## 2015-08-19 LAB — GLUCOSE, CAPILLARY
GLUCOSE-CAPILLARY: 93 mg/dL (ref 65–99)
GLUCOSE-CAPILLARY: 266 mg/dL — AB (ref 65–99)
GLUCOSE-CAPILLARY: 219 mg/dL — AB (ref 65–99)
Glucose-Capillary: 161 mg/dL — ABNORMAL HIGH (ref 65–99)

## 2015-08-19 MED ORDER — CIPROFLOXACIN HCL 500 MG PO TABS
750.0000 mg | ORAL_TABLET | Freq: Two times a day (BID) | ORAL | Status: DC
  Administered 2015-08-19 – 2015-08-20 (×3): 750 mg via ORAL
  Filled 2015-08-19 (×3): qty 2

## 2015-08-19 MED ORDER — METRONIDAZOLE 500 MG PO TABS
500.0000 mg | ORAL_TABLET | Freq: Three times a day (TID) | ORAL | Status: DC
  Administered 2015-08-19 – 2015-08-20 (×4): 500 mg via ORAL
  Filled 2015-08-19 (×4): qty 1

## 2015-08-19 NOTE — Progress Notes (Signed)
Patient requested to have her CBG checked, saying she felt like she didn't eat enough dinner and that it might be low. After checking and telling her it was 40, she still refused her Novolog saying she knows her own body but, she did agree to take her Lantus. Patient was educated and I will continue to monitor her.

## 2015-08-19 NOTE — Progress Notes (Signed)
Family Medicine Teaching Service Daily Progress Note Intern Pager: [phone]  Patient name: Natasha Sexton Medical record number: [id number] Date of birth: 04/17/1933 Age: 82 y.o. Gender: female  Primary Care Provider: Helane Rima, MD Consultants: none Code Status: FULL  Pt Overview and Major Events to Date:  4/13 - admitted to [facility]; started on vanc/zosyn 4/16 - vanc/zosyn d/ced; cipro/flagyl started  Assessment and Plan: Natasha Sexton is a 82 y.o. female presenting with leg ulcer & visual hallucinations. PMH is significant for DM-2, HTN, CAD, diabetic neuropathy.  Cellulitic ulcer in diabetic patients: after traumatic injury ~1 mo ago. No signs of systemic infection. Patient without fever or leukocytosis. Exam remarkable for ulcer over lateral aspect of lower L leg ~2 in in diameter with surrounding erythema and warmth. No fluctuance. WBC 7.5, LA WNL on admission. Sed rate and CRP elevated. Wound and blood cx collected, however blood cx obtained after abx started. LLE x-ray normal. Doubt osteomyelitis but cannot rule out. LE>RE likely due to cellulitis than DVT. WBC still WNL at 6.7 this AM. Seen by wound care yesterday, who recommended follow-up at outpatient wound care center for eventual debridement.  - F/u wound cultures - moderate gram neg rods - F/u blood cultures - NGx2d - Can consider MRI to rule out osteo if not improving - Tylenol PRN pain - Per wound care, place order for continued wound care with eventual debridement at outpatient center prior to d/c - Begin cipro and Flagyl   Visual hallucination (delusional parasitosis): Believes worms are coming out of her mouth. Ongoing for past two wks per family. No prior psychiatric history. Negative head CT in 11/2014. Ddx includes delirium, dementia & psychosis. Responds to questions appropriately. Vit B12, TSH WNL; RPR neg. Hallucinations persist.  - Consider mini cog  - Case management to see patient again to assist  with dispo  UTI: Endorses dysuria. Pyelo less likely without systemic symptoms and CVA tenderness on exam. On Keflex as outpatient (4/11 to admission on 4/13). Unclear if she was taking it. UA on admission with small hemoglobin, but no nitrites or leukocytes (improvement over UA obtained two days prior to presentation).  - F/u urine culture (obtained after abx initiated) - >100K colonies pan-sensitive E.coli - Begin cipro/Flagyl  Elevated creatinine: 1.35 on admission. Baseline 1.1-1.2. 1.22 this AM.  - Encourage PO intake   Cough: Doubt cardiac or pulmonary process with isolated cough. No fever, chest pain or sign of fluid overload. CXR normal 2d prior to admission. O2 sat in high 90s on RA.  - Cont to monitor  Hypertension: hypertensive on arrival (max 195/82), but had not taken meds that day. On losartan and Metoprolol XL at home. BP 156/67, 156/64 overnight.  -Continue home losartan and metoprolol XL  CAD: s/p stent placement in 2008 per patient. No echo in chart. On lovastatin at home. Currently asymptomatic. Triglycerides elevated at 591 with decreased HDL of 33.  - Continue metoprolol - Atorvastatin 40 mg  - Recommend outpatient stress test after d/c  Hyperglycemia/DM-2: Poorly controlled. A1c 15 in 08/2014; improved to 10.9 on admission. BG 500 on arrival, but missed dose of Lantus. Not in DKA or HSS. Reports taking 40U BID, but prior records report 90U at bedtime. Also on Novolog for meal coverage. CBG 202 overnight.  - Lantus 45U at bedtime  - SSI-moderate sensititivity - ACHS CBG  Diabetic neuropathy: Markedly reduced sensation in bilateral LE. On gabapentin 600mg  TID at home, but not complaining of parasthesias. Gabapentin could be contributing to mental  status changes.  - Discontinue gabapentin  - Monitor for parasthesias and resume gabapentin at lower dose if necessary  FEN/GI: Heart healthy and carb modified, saline lock Prophylaxis: Lovenox  Disposition: pending  medical improvement  Subjective:  Patient still complaining of sensation of something in her mouth this AM. Describes the sensation as that of thread in her mouth rather than worms. Denies pain in her legs at all. Would like for gabapentin to be discontinued as she feels this is not helping her and has read it may contribute to confusion.   Objective: Temp:  [96.3 F (35.7 C)-99.8 F (37.7 C)] 99.8 F (37.7 C) (04/16 0409) Pulse Rate:  [64-72] 70 (04/16 0409) Resp:  [12-18] 12 (04/16 0409) BP: (142-156)/(55-67) 156/64 mmHg (04/16 0409) SpO2:  [98 %-100 %] 98 % (04/16 0409) Physical Exam: General: laying comfortably in bed; in NAD Cardiovascular: RRR, no murmurs appreciated Respiratory: CTAB, no wheezes, normal work of breathing on RA Abdomen: soft, non-tender, non-distended, +BS Extremities: ulcerated lesion ~5x3cm at widest point with surrounding erythema, non-tender to palpation Neuro: reduced sensation to touch in bilateral LE; A&Ox4, no focal deficits   Laboratory:  Recent Labs Lab 08/17/15 0438 08/18/15 0517 08/19/15 0520  WBC 6.5 6.7 8.1  HGB 9.1* 8.9* 9.3*  HCT 28.1* 27.4* 28.4*  PLT 230 207 230    Recent Labs Lab 08/14/15 1624 08/16/15 1238 08/17/15 0438 08/18/15 0517 08/19/15 0520  NA 130* 132* 138 139 140  K 4.4 4.5 3.7 3.7 3.4*  CL 99* 98* 105 106 107  CO2 24 22 23 24 24   BUN 37* 35* 23* 22* 16  CREATININE 1.29* 1.35* 1.10* 1.37* 1.22*  CALCIUM 8.8* 9.2 8.7* 8.7* 8.9  PROT 7.3 7.7  --   --   --   BILITOT 0.6 0.6  --   --   --   ALKPHOS 87 89  --   --   --   ALT 25 26  --   --   --   AST 58* 36  --   --   --   GLUCOSE 379* 496* 201* 245* 125*    Imaging/Diagnostic Tests: Dg Tibia/fibula Left 08/16/2015  CLINICAL DATA:  Infected wound in left lower leg. EXAM: LEFT TIBIA AND FIBULA - 2 VIEW. IMPRESSION: No significant abnormality seen involving the left tibia or fibula.  Verner Mould, MD 08/19/2015, 8:50 AM PGY-1, [place] Intern pager: [phone], text pages welcome

## 2015-08-20 DIAGNOSIS — N390 Urinary tract infection, site not specified: Secondary | ICD-10-CM

## 2015-08-20 DIAGNOSIS — L03119 Cellulitis of unspecified part of limb: Secondary | ICD-10-CM

## 2015-08-20 DIAGNOSIS — B9620 Unspecified Escherichia coli [E. coli] as the cause of diseases classified elsewhere: Secondary | ICD-10-CM

## 2015-08-20 DIAGNOSIS — L03116 Cellulitis of left lower limb: Principal | ICD-10-CM

## 2015-08-20 DIAGNOSIS — I25119 Atherosclerotic heart disease of native coronary artery with unspecified angina pectoris: Secondary | ICD-10-CM

## 2015-08-20 DIAGNOSIS — L0291 Cutaneous abscess, unspecified: Secondary | ICD-10-CM

## 2015-08-20 DIAGNOSIS — I10 Essential (primary) hypertension: Secondary | ICD-10-CM

## 2015-08-20 DIAGNOSIS — L02419 Cutaneous abscess of limb, unspecified: Secondary | ICD-10-CM

## 2015-08-20 LAB — WOUND CULTURE

## 2015-08-20 LAB — GLUCOSE, CAPILLARY
GLUCOSE-CAPILLARY: 113 mg/dL — AB (ref 65–99)
Glucose-Capillary: 110 mg/dL — ABNORMAL HIGH (ref 65–99)

## 2015-08-20 MED ORDER — ALUM & MAG HYDROXIDE-SIMETH 200-200-20 MG/5ML PO SUSP
30.0000 mL | Freq: Once | ORAL | Status: AC
  Administered 2015-08-20: 30 mL via ORAL
  Filled 2015-08-20: qty 30

## 2015-08-20 MED ORDER — ONDANSETRON 4 MG PO TBDP
4.0000 mg | ORAL_TABLET | Freq: Once | ORAL | Status: DC
  Filled 2015-08-20: qty 1

## 2015-08-20 MED ORDER — SULFAMETHOXAZOLE-TRIMETHOPRIM 400-80 MG PO TABS
2.0000 | ORAL_TABLET | Freq: Two times a day (BID) | ORAL | Status: DC
  Administered 2015-08-20: 2 via ORAL
  Filled 2015-08-20 (×2): qty 2

## 2015-08-20 MED ORDER — SULFAMETHOXAZOLE-TRIMETHOPRIM 400-80 MG PO TABS: 2.0000 | ORAL_TABLET | Freq: Two times a day (BID) | ORAL | Status: AC

## 2015-08-20 NOTE — Progress Notes (Signed)
Natasha Sexton to be D/C'd to home per MD order.  Discussed with the patient and all questions fully answered.  VSS, Skin clean, dry and intact without evidence of skin break down, no evidence of skin tears noted. IV catheter discontinued intact. Site without signs and symptoms of complications. Dressing and pressure applied.  An After Visit Summary was printed and given to the patient. Patient received prescriptions.  D/c education completed with patient/family including follow up instructions, medication list, d/c activities limitations if indicated, with other d/c instructions as indicated by MD - patient able to verbalize understanding, all questions fully answered.   Patient instructed to return to ED, call 911, or call MD for any changes in condition.   Patient escorted via [place], and D/C home via private auto.  Natasha Sexton 08/20/2015 6:10 PM

## 2015-08-20 NOTE — Discharge Instructions (Signed)
Please continue to take Bactrim (an antibiotic) twice a day for the next five days (last dose in the evening on 4/22) to help heal the wound on your leg.   We have also stopped your gabapentin, so do not continue to take that medication.   Please call the wound care center TODAY to schedule an appointment with them. It is also important to schedule a hospital follow-up appointment with your regular doctor (Dr. Lavone Neri) within one week of leaving the hospital.

## 2015-08-20 NOTE — Progress Notes (Signed)
Family Medicine Teaching Service Daily Progress Note Intern Pager: [phone]  Patient name: Natasha Sexton Medical record number: [id number] Date of birth: March 24, 1933 Age: 82 y.o. Gender: female  Primary Care Provider: Helane Rima, MD Consultants: none Code Status: FULL  Pt Overview and Major Events to Date:  4/13 - admitted to [facility]; started on vanc/zosyn 4/16 - vanc/zosyn d/ced; cipro/flagyl started 4/17 - cipro/Flagyl d/ced; Bactrim started  Assessment and Plan: Natasha Sexton is a 82 y.o. female presenting with leg ulcer & visual hallucinations. PMH is significant for DM-2, HTN, CAD, diabetic neuropathy.  Cellulitic ulcer in diabetic patients: after traumatic injury ~1 mo ago. No signs of systemic infection. Patient without fever or leukocytosis. Exam remarkable for ulcer over lateral aspect of lower L leg ~2 in in diameter with surrounding erythema and warmth. No fluctuance. WBC 7.5, LA WNL on admission. Sed rate and CRP elevated. Wound and blood cx collected, however blood cx obtained after abx started. LLE x-ray normal. Doubt osteomyelitis but cannot rule out. LE>RE likely due to cellulitis than DVT. WBC still WNL at 6.7. Wound care recommending follow-up at outpatient wound care center for eventual debridement. Erythema previously surround wound has now regressed, and patient denies any pain of affected leg.  - F/u wound cultures - moderate Enterobacter and abundant Staph aureus - F/u blood cultures - NGx3d - Tylenol PRN pain - Per wound care, place order for continued wound care with eventual debridement at outpatient center prior to d/c - Begin Bactrim  Visual hallucination (delusional parasitosis): Believes worms are coming out of her mouth. Ongoing for past two wks per family. No prior psychiatric history. Negative head CT in 11/2014. Ddx includes delirium, dementia & psychosis. Responds to questions appropriately. Vit B12, TSH WNL; RPR neg. Hallucinations persist.  -  Case management to see patient again to assist with dispo  UTI: Endorsing dysuria on admission; now resolved. Pyelo less likely without systemic symptoms and CVA tenderness on exam. On Keflex as outpatient (4/11 to admission on 4/13). Unclear if she was taking it. UA on admission with small hemoglobin, but no nitrites or leukocytes (improvement over UA obtained two days prior to presentation).  - F/u urine culture (obtained after abx initiated) - >100K colonies pan-sensitive E.coli - Begin Bactrim  Elevated creatinine: 1.35 on admission. Baseline 1.1-1.2. Has returned to baseline at 1.2 (4/16).  - Encourage PO intake   Cough: Doubt cardiac or pulmonary process with isolated cough. No fever, chest pain or sign of fluid overload. CXR normal 2d prior to admission. O2 sat in high 90s on RA.  - Cont to monitor  Hypertension: hypertensive on arrival (max 195/82), but had not taken meds that day. On losartan and Metoprolol XL at home. BP 165/70, 142/84 overnight.  -Continue home losartan and metoprolol XL  CAD: s/p stent placement in 2008 per patient. No echo in chart. On lovastatin at home. Currently asymptomatic. Triglycerides elevated at 591 with decreased HDL of 33.  - Continue metoprolol - Atorvastatin 40 mg  - Recommend outpatient stress test after d/c  Hyperglycemia/DM-2: Poorly controlled. A1c 15 in 08/2014; improved to 10.9 on admission. BG 500 on arrival, but missed dose of Lantus. Not in DKA or HSS. Reports taking 40U BID, but prior records report 90U at bedtime. Also on Novolog for meal coverage. CBG 110 overnight.  - Lantus 45U at bedtime  - SSI-moderate sensititivity - ACHS CBG  Diabetic neuropathy: Markedly reduced sensation in bilateral LE. On gabapentin 600mg  TID at home, but not complaining of parasthesias.  Gabapentin could be contributing to mental status changes. Patient now endorsing paraesthesias since discontinuing gabapentin, though does not wish to resume med at this time.   - Discontinue gabapentin  - Consider resuming if paresthesias persist or worsen  FEN/GI: Heart healthy and carb modified, saline lock Prophylaxis: Lovenox  Disposition: pending medical improvement  Subjective:  Patient denies pain in her affected leg, though does endorse some paresthesias now that gabapentin has been discontinued. She does not wish to resume gabapentin at this time however.  Patient still reporting sensation of worms or threads in her mouth, but knows they are not real.   Objective: Temp:  [97.9 F (36.6 C)-98.1 F (36.7 C)] 98.1 F (36.7 C) (04/16 2043) Pulse Rate:  [65-71] 71 (04/17 0830) Resp:  [14-18] 14 (04/16 2043) BP: (142-165)/(65-84) 142/84 mmHg (04/17 0830) SpO2:  [98 %-100 %] 100 % (04/16 2043) Physical Exam: General: resting comfortably in bed; in NAD Cardiovascular: RRR, no murmurs appreciated Respiratory: CTAB, no wheezes, normal work of breathing on RA Abdomen: soft, non-tender, non-distended, +BS Extremities: ulcerated lesion ~5x3cm at widest point, non-tender to palpation, erythema has now resolved Neuro: reduced sensation to touch in bilateral LE; A&Ox4, no focal deficits   Laboratory:  Recent Labs Lab 08/17/15 0438 08/18/15 0517 08/19/15 0520  WBC 6.5 6.7 8.1  HGB 9.1* 8.9* 9.3*  HCT 28.1* 27.4* 28.4*  PLT 230 207 230    Recent Labs Lab 08/14/15 1624 08/16/15 1238 08/17/15 0438 08/18/15 0517 08/19/15 0520  NA 130* 132* 138 139 140  K 4.4 4.5 3.7 3.7 3.4*  CL 99* 98* 105 106 107  CO2 24 22 23 24 24   BUN 37* 35* 23* 22* 16  CREATININE 1.29* 1.35* 1.10* 1.37* 1.22*  CALCIUM 8.8* 9.2 8.7* 8.7* 8.9  PROT 7.3 7.7  --   --   --   BILITOT 0.6 0.6  --   --   --   ALKPHOS 87 89  --   --   --   ALT 25 26  --   --   --   AST 58* 36  --   --   --   GLUCOSE 379* 496* 201* 245* 125*    Imaging/Diagnostic Tests: Dg Tibia/fibula Left 08/16/2015  CLINICAL DATA:  Infected wound in left lower leg. EXAM: LEFT TIBIA AND FIBULA - 2  VIEW. IMPRESSION: No significant abnormality seen involving the left tibia or fibula.  Verner Mould, MD 08/20/2015, 11:59 AM PGY-1, [place] Intern pager: [phone], text pages welcome

## 2015-08-20 NOTE — Care Management Important Message (Signed)
Important Message  Patient Details  Name: Natasha Sexton MRN: [id number] Date of Birth: 11-19-1932   Medicare Important Message Given:  Yes    Natasha Sexton 08/20/2015, 2:02 PM

## 2015-08-20 NOTE — Progress Notes (Signed)
Physical Therapy Treatment Patient Details Name: Natasha Sexton MRN: [id number] DOB: 11-Jan-1933 Today's Date: 08/20/2015    History of Present Illness Natasha Sexton is a 82 y.o. female presenting with leg ulcer & visual hallucination. PMH is significant for DM-2, HTN, CAD, diabetic neuropathy.    PT Comments    PT focusing on mobility progression during session. At this time the pt does present with poor safety awareness and instability with ambulation. Recommending 24 hour supervision and assistance with all mobility. Medical record indicates that the patient has 24 hour supervision at home however, no family is present to confirm this. If family able to provide 24 hour supervision and assistance with mobility, anticipate D/C to home. If this is not the case, pt may benefit from SNF prior to D/C. PT to continue to follow.    Follow Up Recommendations  Supervision/Assistance - 24 hour;Home health PT (assist with mobilty)     Equipment Recommendations  None recommended by PT    Recommendations for Other Services       Precautions / Restrictions Precautions Precautions: Fall Restrictions Weight Bearing Restrictions: No    Mobility  Bed Mobility Overal bed mobility: Modified Independent             General bed mobility comments: using rail to assist  Transfers Overall transfer level: Needs assistance Equipment used: 4-wheeled walker Transfers: Sit to/from Stand Sit to Stand: Min assist         General transfer comment: Repeated cues and physical assist needed to perform transfer safely  Ambulation/Gait Ambulation/Gait assistance: Min assist Ambulation Distance (Feet): 50 Feet (25' X2 with seated rest ) Assistive device: 4-wheeled walker Gait Pattern/deviations: Step-through pattern;Decreased step length - right;Decreased step length - left;Trunk flexed Gait velocity: decreased   General Gait Details: slow pattern, assist needed to maneuver walker as  well as assist for stability. Pt requesting a seated break during ambulation due to fatigue.    Stairs            Wheelchair Mobility    Modified Rankin (Stroke Patients Only)       Balance Overall balance assessment: Needs assistance Sitting-balance support: No upper extremity supported Sitting balance-Leahy Scale: Fair     Standing balance support: Bilateral upper extremity supported Standing balance-Leahy Scale: Poor Standing balance comment: needing walker and physical assist.                     Cognition Arousal/Alertness: Awake/alert Behavior During Therapy: WFL for tasks assessed/performed Overall Cognitive Status: History of cognitive impairments - at baseline (poor safety awareness, poor short term memory)       Memory: Decreased short-term memory              Exercises      General Comments        Pertinent Vitals/Pain Pain Assessment: No/denies pain    Home Living                      Prior Function            PT Goals (current goals can now be found in the care plan section) Acute Rehab PT Goals Patient Stated Goal: go home PT Goal Formulation: With patient Time For Goal Achievement: 08/31/15 Potential to Achieve Goals: Good    Frequency  Min 3X/week    PT Plan Current plan remains appropriate    Co-evaluation             End of  Session Equipment Utilized During Treatment: Gait belt Activity Tolerance: Patient tolerated treatment well Patient left: in chair;with call bell/phone within reach;with chair alarm set     Time: [id number] PT Time Calculation (min) (ACUTE ONLY): 15 min  Charges:  $Gait Training: 8-22 mins                    G Codes:      Cassell Clement, PT, CSCS Pager [phone] Office [phone]  08/20/2015, 10:45 AM

## 2015-08-21 ENCOUNTER — Other Ambulatory Visit: Payer: Self-pay | Admitting: Internal Medicine

## 2015-08-21 DIAGNOSIS — L03119 Cellulitis of unspecified part of limb: Secondary | ICD-10-CM

## 2015-08-21 LAB — CULTURE, BLOOD (ROUTINE X 2)
Culture: NO GROWTH
Culture: NO GROWTH

## 2015-08-23 ENCOUNTER — Other Ambulatory Visit: Payer: Self-pay | Admitting: Internal Medicine

## 2015-08-23 DIAGNOSIS — L97911 Non-pressure chronic ulcer of unspecified part of right lower leg limited to breakdown of skin: Secondary | ICD-10-CM

## 2015-08-23 NOTE — Progress Notes (Signed)
Spoke with patient's son Yvone Neu who stated that patient needs [facility] RN for wound care because he cannot get into Wound clinic for two weeks. Spoke with Dr Llana Aliment, she is to place [facility] RN order with instructions for wound care. Spoke to [place] at [facility] to update with RN order and to follow up with patient son to ensure that [facility] is established.

## 2015-09-26 ENCOUNTER — Ambulatory Visit (INDEPENDENT_AMBULATORY_CARE_PROVIDER_SITE_OTHER): Payer: Medicare PPO | Admitting: Family Medicine

## 2015-09-26 VITALS — BP 160/84 | HR 95 | Temp 98.0°F | Ht 64.0 in | Wt 187.2 lb

## 2015-09-26 DIAGNOSIS — N3000 Acute cystitis without hematuria: Secondary | ICD-10-CM | POA: Diagnosis not present

## 2015-09-26 DIAGNOSIS — R350 Frequency of micturition: Secondary | ICD-10-CM

## 2015-09-26 DIAGNOSIS — F6811 Factitious disorder with predominantly psychological signs and symptoms: Secondary | ICD-10-CM | POA: Diagnosis not present

## 2015-09-26 DIAGNOSIS — E1140 Type 2 diabetes mellitus with diabetic neuropathy, unspecified: Secondary | ICD-10-CM | POA: Diagnosis not present

## 2015-09-26 DIAGNOSIS — E1165 Type 2 diabetes mellitus with hyperglycemia: Secondary | ICD-10-CM

## 2015-09-26 DIAGNOSIS — F22 Delusional disorders: Secondary | ICD-10-CM

## 2015-09-26 DIAGNOSIS — Z794 Long term (current) use of insulin: Secondary | ICD-10-CM

## 2015-09-26 DIAGNOSIS — IMO0002 Reserved for concepts with insufficient information to code with codable children: Secondary | ICD-10-CM

## 2015-09-26 LAB — POCT URINALYSIS DIPSTICK
Bilirubin, UA: NEGATIVE
Ketones, UA: NEGATIVE
Leukocytes, UA: NEGATIVE
NITRITE UA: NEGATIVE
SPEC GRAV UA: 1.02
UROBILINOGEN UA: 4
pH, UA: 7.5

## 2015-09-26 LAB — POCT GLUCOSE (DEVICE FOR HOME USE)
POC Glucose: 233 mg/dl — AB (ref 70–99)
POC Glucose: 233 mg/dl — AB (ref 70–99)

## 2015-09-26 MED ORDER — CEPHALEXIN 500 MG PO CAPS: 500.0000 mg | ORAL_CAPSULE | Freq: Two times a day (BID) | ORAL | Status: DC

## 2015-09-26 MED ORDER — VENLAFAXINE HCL ER 37.5 MG PO CP24: 37.5000 mg | ORAL_CAPSULE | Freq: Every day | ORAL | Status: AC

## 2015-09-26 NOTE — Patient Instructions (Addendum)
We are going to treat you for a presumed UTI while I wait on your urine culture Please continue to use your insulin for your diabetes I do not think that your blood pressure or cholesterol medications cause your hallucinations.   Please start back on your lovastatin and lovaza (cholesterol)  Please start back on your losartan for your blood pressure I am not sure if we will need to start you back on the metoprolol or not- it depends on how your blood pressures do  I will be in touch with your urine culture asap  Please come and see me in 2-3 weeks and bring all the medications that you are taking  I am going to refer you to an endocrinologist to help [place] with your diabetes Let's start effexor for your symptoms of worms in your mouth. I do need to make sure that you do not have angle closure glaucoma; this is rare so you can go ahead and start on the medication

## 2015-09-26 NOTE — Progress Notes (Signed)
Pre visit review using our clinic tool,if applicable. No additional management support is needed unless otherwise documented below in the visit note.  

## 2015-09-26 NOTE — Progress Notes (Signed)
[place] at [facility] [address], [place], [place] [address] [phone]  Date:  09/26/2015   Name:  Natasha Sexton   DOB:  1933-01-04   MRN:  [id number]  PCP:  Lamar Blinks, MD    Chief Complaint: Establish Care and Urinary Frequency   History of Present Illness:  Natasha Sexton is a 82 y.o. very pleasant female patient who presents with the following:  Here today as a new patient to establish care.  She has a complex medical history.  She was inpt 4/13 to 4/17 with delusions of parasitosis (worms in her mouth), UTI, cellulitis of a lower leg wound, HTN, hyperlipidemia and uncontrolled DM   Lab Results  Component Value Date   HGBA1C 10.9* 08/16/2015   She is now not taking any of her medications except for her insulin.  She stats that she is afraid one of her medications was causing her delusions so she has been afraid to take them.  On discussion it is not clear to me if the patient realizes that there are not really worms in her mouth.  However she is open to trying to treat "the worms" with psychotropic medications so I hope that she will be able to recover.  Explained that although I understand that she feels like she has worms in her mouth, she does not actually have worms and we will work to make this feeling go away.   She does not have a psychiatrist  She is still taking her lantus and also her novolog.  Recent A1c shows suboptimal but not terrible glucose control  She had been taking gabapentin for her neuropathy- stopped taking this  She has noted some dysuria and urinary frequency again.  No urine culture from most recent admission  She does check her glucose at home- she has been trying to eat less; this am 170  Her left leg is doing "a lot better."  She is seeing wound care.  No longer on abx for her cellulitis  She notes that she does not sleep well.  Her daughter notes that she often will have  dramatic mood swings- be crying one minute and singing the next.  Overall however her mood has been poor and she is often nervous She was on prozac a while ago that seemed to help her, but she stopped taking this because it gave a friend of hers seizures.   She also has chronic diarhea after eating.   She has complaint of not sleeping well and of GERD at night- her daugter thinks this may be due to drinking a lot of coffee in the afternoon and evening  Wt Readings from Last 3 Encounters:  09/26/15 187 lb 3.2 oz (84.913 kg)  08/16/15 199 lb 8 oz (90.493 kg)  03/28/15 200 lb (90.719 kg)    Patient Active Problem List   Diagnosis Date Noted  . E. coli UTI   . Coronary artery disease involving native coronary artery of native heart with angina pectoris ([place])   . Wound cellulitis   . Delusion of infestation ([place])   . Diabetes mellitus type 2 with neurological manifestations ([place])   . Cellulitis 08/16/2015  . Acute kidney injury ([place]) 08/27/2014  . Hyperglycemia due to type 2 diabetes mellitus ([place]) 08/27/2014  . Uncontrolled type 2 diabetes with retinopathy ([place]) 08/27/2014  . Glaucoma 08/27/2014  . 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 Dehydration 08/27/2014  . Acute encephalopathy 08/27/2014  .  Essential hypertension 08/27/2014  . Hyperglycemia 08/25/2014    Past Medical History  Diagnosis Date  . Diabetes mellitus without complication ([place])   . Hypertension   . Coronary artery disease   . Arthritis   . Glaucoma   . Cancer [facility]) 2005    Breast    Past Surgical History  Procedure Laterality Date  . Cholecystectomy      Social History  Substance Use Topics  . Smoking status: Never Smoker   . Smokeless tobacco: Not on file  . Alcohol Use: No    Family History  Problem Relation Age of Onset  . Stroke Father   . Diabetes Mother     Allergies  Allergen Reactions  . Ivp Dye [Iodinated Diagnostic Agents]     Rash     Medication list has been reviewed and  updated.  Current Outpatient Prescriptions on File Prior to Visit  Medication Sig Dispense Refill  . B-D ULTRAFINE III SHORT PEN 31G X 8 MM MISC See admin instructions. Reported on 09/26/2015  1  . insulin aspart (NOVOLOG) 100 UNIT/ML FlexPen Inject 12 Units into the skin 3 (three) times daily with meals. (Patient not taking: Reported on 09/26/2015) 15 mL 3  . LANTUS SOLOSTAR 100 UNIT/ML Solostar Pen Inject 90 Units into the skin at bedtime. (Patient not taking: Reported on 09/26/2015) 15 mL 1  . losartan (COZAAR) 50 MG tablet Take 2 tablets (100 mg total) by mouth daily. (Patient not taking: Reported on 09/26/2015) 30 tablet 0  . lovastatin (MEVACOR) 40 MG tablet Take 40 mg by mouth at bedtime. Reported on 09/26/2015  1  . metoprolol succinate (TOPROL-XL) 50 MG 24 hr tablet Take 50 mg by mouth daily. Reported on 09/26/2015    . NOVOFINE PLUS 32G X 4 MM MISC Reported on 09/26/2015  6  . omega-3 acid ethyl esters (LOVAZA) 1 G capsule Take 4 g by mouth daily. Reported on 09/26/2015  11  . timolol (TIMOPTIC) 0.5 % ophthalmic solution Place 1 drop into the left eye every morning. Reported on 09/26/2015  5  . traMADol (ULTRAM) 50 MG tablet Take 1 tablet (50 mg total) by mouth 3 (three) times daily as needed for moderate pain (leg pains). (Patient not taking: Reported on 09/26/2015) 30 tablet 0  . Travoprost, BAK Free, (TRAVATAN) 0.004 % SOLN ophthalmic solution Place 1 drop into both eyes at bedtime. Reported on 09/26/2015     No current facility-administered medications on file prior to visit.    Review of Systems:  As per HPI- otherwise negative.   Physical Examination: Filed Vitals:   09/26/15 1342 09/26/15 1356  BP: 184/87 160/84  Pulse: 95   Temp: 98 F (36.7 C)    Filed Vitals:   09/26/15 1342  Height: 5\' 4"  (1.626 m)  Weight: 187 lb 3.2 oz (84.913 kg)   Body mass index is 32.12 kg/(m^2). Ideal Body Weight: Weight in (lb) to have BMI = 25: 145.3  GEN: WDWN, NAD, Non-toxic, A & O x  3 HEENT: Atraumatic, Normocephalic. Neck supple. No masses, No LAD.  Bilateral TM wnl, oropharynx normal.  PEERL,EOMI.   Ears and Nose: No external deformity. CV: RRR, No M/G/R. No JVD. No thrill. No extra heart sounds. PULM: CTA B, no wheezes, crackles, rhonchi. No retractions. No resp. distress. No accessory muscle use. EXTR: No c/c/e NEURO Normal gait.  PSYCH: Normally interactive. Conversant. Not depressed or anxious appearing.  Calm demeanor.   Cr clearance is 38  Results for orders  placed or performed in visit on 09/26/15  POCT Urinalysis Dipstick  Result Value Ref Range   Color, UA Pale Yellow    Clarity, UA Cloudy    Glucose, UA 1+    Bilirubin, UA Neg    Ketones, UA Neg    Spec Grav, UA 1.020    Blood, UA 1+    pH, UA 7.5    Protein, UA 2+    Urobilinogen, UA 4.0    Nitrite, UA Neg    Leukocytes, UA Negative Negative  POCT Glucose (Device for Home Use)  Result Value Ref Range   Glucose Fasting, POC  70 - 99 mg/dL   POC Glucose 233 (A) 70 - 99 mg/dl  POCT Glucose (Device for Home Use)  Result Value Ref Range   Glucose Fasting, POC  70 - 99 mg/dL   POC Glucose 233 (A) 70 - 99 mg/dl    Assessment and Plan: Acute cystitis without hematuria - Plan: cephALEXin (KEFLEX) 500 MG capsule, Urine culture, Urine Culture  Frequency of urination - Plan: POCT Urinalysis Dipstick, Glucose, fingerstick (stat), Glucose, fingerstick (stat), POCT Glucose (Device for Home Use), POCT Glucose (Device for Home Use), POCT Glucose (Device for Home Use)  Ekbom's delusional parasitosis - Plan: venlafaxine XR (EFFEXOR-XR) 37.5 MG 24 hr capsule  Uncontrolled type 2 diabetes mellitus with diabetic neuropathy, with long-term current use of insulin ([place]) - Plan: Ambulatory referral to Endocrinology  Here today as a new patient to discuss several issues.  She is suffering from psychiatric disease and also uncontrolled DM.  She may benefit from antipsychotic medications but we will need to be sure  her DM is controlled first. They are open to seeing psychiatry but understand finding a doctor who takes medicare may be hard.  For the time being I will start her on effexor in hopes of improving her underlying anxiety  I spent over 45 min face to face with pt today, more than 50% in education and counseling   We are going to treat you for a presumed UTI while I wait on your urine culture Please continue to use your insulin for your diabetes I do not think that your blood pressure or cholesterol medications cause your hallucinations.   Please start back on your lovastatin and lovaza (cholesterol)  Please start back on your losartan for your blood pressure I am not sure if we will need to start you back on the metoprolol or not- it depends on how your blood pressures do  I will be in touch with your urine culture asap  Please come and see me in 2-3 weeks and bring all the medications that you are taking  I am going to refer you to an endocrinologist to help [place] with your diabetes Let's start effexor for your symptoms of worms in your mouth. I do need to make sure that you do not have angle closure glaucoma; this is rare so you can go ahead and start on the medication   Signed Lamar Blinks, MD

## 2015-09-29 LAB — URINE CULTURE

## 2015-10-03 ENCOUNTER — Encounter: Payer: Self-pay | Admitting: Family Medicine

## 2015-10-16 ENCOUNTER — Telehealth: Payer: Self-pay | Admitting: Family Medicine

## 2015-10-16 NOTE — Telephone Encounter (Signed)
Returned call to [facility] w/ [place]. She reports pt has hallucinations at baseline, but they have been worse since taking gabapentin, so the patient stopped taking yesterday, and is now experiencing increased pain as a result. HHRN reports pt sees worms in her gums at baseline and pt is aware that no one else can see them. Since taking gabapentin, she also having new visual hallucinations of people she does not know. HHRN reports pt can describe these people in "great detail."   HHRN also wanted to report pt's CBG log as follows. 10/13/15: fasting-167; before lunch-283; before dinner-302 10/14/15: fasting-167; before dinner-277 10/15/15: fasting-220 10/16/15: 1 hr after [id number]  Per chart review, we have referred to endo for uncontrolled DM. Pt has new pt appt w/ Dr. Cruzita Lederer 11/23/15. Per last OV note, she is due for follow-up in our office now. Advised HHRN to have pt schedule appt and she agreed. Please advise if any other changes.

## 2015-10-16 NOTE — Telephone Encounter (Signed)
Caller name:Elaine  Relation to pt:RN from [facility]  Call back number: [phone]   Reason for call:  Wanted to inform PCP patient stopped taking gababtin due to her hallucinating. Nurse would like to discuss blood sugar levels

## 2015-10-17 NOTE — Telephone Encounter (Signed)
Called her daughter Natasha Sexton but did not reach.   At this time may want to start some risperdone for her  Lab Results  Component Value Date   HGBA1C 10.9* 08/16/2015

## 2015-10-18 NOTE — Telephone Encounter (Signed)
Called and spoke with Natasha Sexton- she is concerned that daughter is not providing much supervision or help with medications. She is not involved when [place] visits the home. Her glucose is sometimes over 200 which is why she called me to report She has an appt on Monday so hopefully they will come in then Called her daughter again but no answer, no VM

## 2015-10-22 ENCOUNTER — Ambulatory Visit (INDEPENDENT_AMBULATORY_CARE_PROVIDER_SITE_OTHER): Payer: Medicare PPO | Admitting: Family Medicine

## 2015-10-22 ENCOUNTER — Encounter: Payer: Self-pay | Admitting: Family Medicine

## 2015-10-22 VITALS — BP 116/65 | HR 90 | Temp 98.3°F | Ht 65.0 in | Wt 182.4 lb

## 2015-10-22 DIAGNOSIS — L97921 Non-pressure chronic ulcer of unspecified part of left lower leg limited to breakdown of skin: Secondary | ICD-10-CM | POA: Diagnosis not present

## 2015-10-22 DIAGNOSIS — E118 Type 2 diabetes mellitus with unspecified complications: Secondary | ICD-10-CM | POA: Diagnosis not present

## 2015-10-22 DIAGNOSIS — F22 Delusional disorders: Secondary | ICD-10-CM

## 2015-10-22 DIAGNOSIS — F6811 Factitious disorder with predominantly psychological signs and symptoms: Secondary | ICD-10-CM

## 2015-10-22 LAB — POCT GLUCOSE (DEVICE FOR HOME USE): POC GLUCOSE: 360 mg/dL — AB (ref 70–99)

## 2015-10-22 MED ORDER — SILVER SULFADIAZINE 1 % EX CREA: 1.0000 "application " | TOPICAL_CREAM | Freq: Every day | CUTANEOUS | Status: AC

## 2015-10-22 MED ORDER — BLOOD GLUCOSE METER KIT: PACK | Status: AC
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
## 2015-10-22 NOTE — Progress Notes (Signed)
Pre visit review using our clinic review tool, if applicable. No additional management support is needed unless otherwise documented below in the visit note. 

## 2015-10-22 NOTE — Patient Instructions (Addendum)
Apply silvadene to your leg wound once a day.  Keep the wound covered while you are out, but you can leave it open to the air some of the time also.  This may help it to heal more quickly Your diabetes does appear to be out of control still -  Be sure to attend your upcoming endocrinology appointment!!     I am going to give you a call with your labs asap

## 2015-10-23 ENCOUNTER — Other Ambulatory Visit: Payer: Self-pay | Admitting: Family Medicine

## 2015-10-23 LAB — HEMOGLOBIN A1C: Hgb A1c MFr Bld: 9.4 % — ABNORMAL HIGH (ref 4.6–6.5)

## 2015-10-23 LAB — BASIC METABOLIC PANEL
BUN: 31 mg/dL — ABNORMAL HIGH (ref 6–23)
CHLORIDE: 94 meq/L — AB (ref 96–112)
CO2: 27 meq/L (ref 19–32)
Calcium: 9.9 mg/dL (ref 8.4–10.5)
Creatinine, Ser: 1.56 mg/dL — ABNORMAL HIGH (ref 0.40–1.20)
GFR: 33.89 mL/min — ABNORMAL LOW (ref >60.00)
Glucose, Bld: 398 mg/dL — ABNORMAL HIGH (ref 70–99)
Potassium: 4.9 mEq/L (ref 3.5–5.1)
SODIUM: 132 meq/L — AB (ref 135–145)

## 2015-10-24 ENCOUNTER — Other Ambulatory Visit: Payer: Self-pay | Admitting: Family Medicine

## 2015-10-31 ENCOUNTER — Telehealth: Payer: Self-pay | Admitting: Family Medicine

## 2015-10-31 NOTE — Telephone Encounter (Signed)
-----   Message from Renato Shin, MD sent at 10/29/2015  4:08 PM EDT ----- J Pending the appt, please increase the lantus to 50-BID Thank you. S

## 2015-10-31 NOTE — Telephone Encounter (Signed)
Called her to pass along Dr. Cordelia Pen recommendation to increase lantus to 50 BID.  Left a detailed message with this info for her

## 2015-11-14 ENCOUNTER — Ambulatory Visit: Payer: Medicare PPO | Admitting: Endocrinology

## 2015-11-16 ENCOUNTER — Ambulatory Visit: Payer: Medicare PPO | Admitting: Endocrinology

## 2015-11-16 ENCOUNTER — Other Ambulatory Visit: Payer: Self-pay | Admitting: Family Medicine

## 2015-11-23 ENCOUNTER — Ambulatory Visit: Payer: Medicare PPO | Admitting: Internal Medicine

## 2015-11-27 ENCOUNTER — Ambulatory Visit: Payer: Medicare PPO | Admitting: Internal Medicine

## 2016-01-10 DIAGNOSIS — I10 Essential (primary) hypertension: Secondary | ICD-10-CM | POA: Insufficient documentation

## 2016-01-10 DIAGNOSIS — E119 Type 2 diabetes mellitus without complications: Secondary | ICD-10-CM | POA: Insufficient documentation

## 2016-01-10 DIAGNOSIS — I25119 Atherosclerotic heart disease of native coronary artery with unspecified angina pectoris: Secondary | ICD-10-CM | POA: Insufficient documentation

## 2016-01-10 DIAGNOSIS — E785 Hyperlipidemia, unspecified: Secondary | ICD-10-CM | POA: Insufficient documentation

## 2016-01-10 NOTE — Progress Notes (Signed)
[facility]     Referring Physician: Dorene Grebe, MD    Date of Consultation: 01/11/2016    Chief Complaint   Patient presents with   . Chest Pain     Last office visit 05/27/11 with Dr. Rich Brave.  Pt seen [facility] 11/30/15-12/01/15 for chest pain.  Pt had Xray of chest 12/31/15 for Contusion of Chest wall when she fell out of bed while sleeping.  Pt denies chest pain, palpitations, and SOB.  Pt reports swelling in lower extremities.       Assessment and Plan     1. Chest Pain    She complains of episode of intermittent chest discomfort.  She was seen at [facility] for Atypical chest discomfort.  She was hospitalized overnight.  Her cardiac enzymes were unremarkable.  She was discharged home.   In light of her history of known coronary artery disease.  I scheduled her to undergo  pharmacological stress test     - ECG 12 lead (Today)  - Lexiscan Nuclear Stress Test; Future    2. Edema   She appears to be mildly volume overloaded.  I placed her on furosemide 20 mg to take every other day.  We will obtain laboratory study including BMP and BNP level    - furosemide (LASIX) 20 MG tablet; 1 tablet every other day  Dispense: 45 tablet; Refill: 3  - B-type Natriuretic Peptide; Future  - Basic Metabolic Panel; Future    3. Palpitations   She experienced infrequent symptom of palpitation. There clinical description of her symptom of palpitation, is suggestive of extra systolic beats, most likely PVCs.    4. Coronary Artery Disease - S/P Stent 2007    Complains of chest pain as mentioned above.     - ECG 12 lead (Today)  - Lexiscan Nuclear Stress Test; Future  - metoprolol XL (TOPROL-XL) 50 MG 24 hr tablet; Take 1 tablet (50 mg total) by mouth daily.  Dispense: 90 tablet; Refill: 3    5. Essential Hypertension   Blood Pressure appears well controlled, I have advised the patient to continue the present medical regimen.    6. Hyperlipidemia LDL goal <70  Lipid Panel 01/6294: Total Cholesterol: 190,  Triglycerides: 298, HDL: 50, LDL: 80 mg/dL   I have advised the patient to continue to follow up with their Primary Care Provider for routine lipid and liver function testing.    7. Type 2 Diabetes Mellitus       Previous Cardiovascular Testing      Echocardiogram 04/22/2011:  Normal Left Ventricular Systolic Function, EF 65%, Mildly dilated Left Atrium, Minimal mitral valve stenosis, There is evidence of borderline pulmonary hypertension.     Stress Myocardial Perfusion Study 04/21/2011: Normal Stress Myocardial Perfusion Study, with no evidence of exercise induced ischemia. Normal Left Ventricular Systolic Function, EF 67%. Nuclear SPECT images demonstrate normal perfusion with no defects to suggest ischemia or previous infarct    History of Present Illness     Thank you for your referral of Stacie Brock for cardiology consultation to evaluate Chest Pain       Past Medical History     Past Medical History   Diagnosis Date   . Diabetes mellitus      type 2   . Diabetic retinopathy    . Osteoarthritis      in knees   . Venous insufficiency    . Hyperlipidemia    . Hypertension    .  Coronary artery disease    . Peripheral neuropathy    . Blindness      in right eye   . Anxiety    . Depression    . Breast cancer      Past Surgical History     Past Surgical History   Procedure Laterality Date   . Toe amputation     . Eye surgery       for vein occlusion   . Breast surgery Left      lumpectomy      Social History     Social History   Substance Use Topics   . Smoking status: Never Smoker    . Smokeless tobacco: None   . Alcohol Use: No     Family History     Family History   Problem Relation Age of Onset   . Diabetes Mother    . Stroke Father      Allergies     Allergies   Allergen Reactions   . Iodine Hives   . Iodinated Diagnostic Agents Other (See Comments)   . Ioxaglate Other (See Comments)   . Nystatin Rash       Medications     Current Outpatient Prescriptions   Medication Sig Dispense Refill   . insulin  aspart (NOVOLOG FLEXPEN) 100 UNIT/ML injection pen Inject 10 Units into the skin.Sliding scale prior to meals     . insulin glargine (LANTUS SOLOSTAR) 100 UNIT/ML injection pen Inject 45 Units into the skin daily.INJECT 45 UNITS SC BID         . timolol (TIMOPTIC) 0.5 % ophthalmic solution Apply 1 drop to eye.Apply 1 drop to left eye.     Marland Kitchen aspirin 81 MG chewable tablet Chew 1 tablet by mouth daily.     Marland Kitchen atorvastatin (LIPITOR) 20 MG tablet Take 20 mg by mouth daily.  1   . DULoxetine (CYMBALTA) 20 MG capsule Take 1 capsule by mouth daily.  0   . furosemide (LASIX) 20 MG tablet 1 tablet every other day 45 tablet 3   . HYDROcodone-acetaminophen (NORCO) 5-325 MG per tablet Take 1 tablet by mouth 3 (three) times daily as needed.  0   . losartan (COZAAR) 100 MG tablet Take 1 tablet by mouth daily.  1   . metoprolol XL (TOPROL-XL) 50 MG 24 hr tablet Take 1 tablet (50 mg total) by mouth daily. 90 tablet 3   . mupirocin (BACTROBAN) 2 % ointment Apply to affected area BID  0   . traMADol (ULTRAM) 50 MG tablet Take 2 tablets by mouth every 8 (eight) hours as needed.  0   . TRAVATAN Z 0.004 % Solution ophthalmic solution INSTILL 1 DROP INTO BOTH EYES HS  0     No current facility-administered medications for this visit.     Review of Systems     General:Not Present- Fatigue, Weight Gain and Weight Loss.  Respiratory:Not Present- Bloody sputum, Cough, and Wheezing.  Cardiovascular:Not Present- Calf, thigh or buttock pain with walking,  Difficulty Breathing Lying Down, Awakening Short of Breath   Gastrointestinal:Not Present- Bloody Stool, Constipation, Diarrhea, Hematemesis, Indigestion, Nausea and Vomiting.  Neurological:Not Present- Dizziness, Syncope and Weakness.  Musculoskeletal:  No arthritic symptoms  Genitourinary: Negative for dysuria    Otherwise 10 point review of systems is negative.    Physical Exam     Filed Vitals:    01/11/16 1610   BP: 122/72   Pulse: 73   Height: 1.626 m (  5\' 4" )   Weight: 81.012 kg (178  lb 9.6 oz)     Body mass index is 30.64 kg/(m^2).    General:  Patient appears their stated age, well-nourished.  Alert and in no apparent distress.  Eyes: No conjunctivitis, no purulent discharge, no lid lag  ENT:  Hearing grossly intact, Nares patent bilaterally, Lips moist, color appropriate for race.  Respiratory: Clear to auscultation throughout. Respiratory effort unlabored, chest expansion symmetric.    Cardio: Regular rate and rhythm. Normal S1/S2 No carotid bruits, no JVD.   Extremities: warm, pulses 2+, +1-2  peripheral edema  GI: Soft, nondistended, nontender.  No guarding or rebound.  Skin: Color appropriate for race, Skin warm, dry, and intact  Psychiatric: She has difficulty with her short-term memory likely has underlying dementia     Labs     Lipid Panel No results found for: CHOL, TRIG, HDL  CBC No results found for: WBC, HGB, HCT, MCV, PLT   BMP: No results found for: NA, K, CL, CO2, BUN, CREAT, GLU, CA  INR No results found for: INR, PROTIME     EKG         Follow-Up     Return in about 6 months (around 07/10/2016) for follow-up to the ordered testing.      Warmest Regards,  Ashante Snelling [place], [place]  [facility]

## 2016-01-11 ENCOUNTER — Encounter (INDEPENDENT_AMBULATORY_CARE_PROVIDER_SITE_OTHER): Payer: Self-pay | Admitting: Cardiovascular Disease

## 2016-01-11 ENCOUNTER — Ambulatory Visit (INDEPENDENT_AMBULATORY_CARE_PROVIDER_SITE_OTHER): Payer: Medicare PPO | Admitting: Cardiovascular Disease

## 2016-01-11 VITALS — BP 122/72 | HR 73 | Ht 64.0 in | Wt 178.6 lb

## 2016-01-11 DIAGNOSIS — I10 Essential (primary) hypertension: Secondary | ICD-10-CM

## 2016-01-11 DIAGNOSIS — R0602 Shortness of breath: Secondary | ICD-10-CM

## 2016-01-11 DIAGNOSIS — I25119 Atherosclerotic heart disease of native coronary artery with unspecified angina pectoris: Secondary | ICD-10-CM

## 2016-01-11 DIAGNOSIS — E119 Type 2 diabetes mellitus without complications: Secondary | ICD-10-CM

## 2016-01-11 DIAGNOSIS — E785 Hyperlipidemia, unspecified: Secondary | ICD-10-CM

## 2016-01-11 DIAGNOSIS — R002 Palpitations: Secondary | ICD-10-CM

## 2016-01-11 DIAGNOSIS — R079 Chest pain, unspecified: Secondary | ICD-10-CM

## 2016-01-11 DIAGNOSIS — R609 Edema, unspecified: Secondary | ICD-10-CM

## 2016-01-11 MED ORDER — FUROSEMIDE 20 MG PO TABS
ORAL_TABLET | ORAL | Status: AC
Start: 2016-01-11 — End: ?

## 2016-01-11 MED ORDER — METOPROLOL SUCCINATE ER 50 MG PO TB24
50.0000 mg | ORAL_TABLET | Freq: Every day | ORAL | Status: AC
Start: 2016-01-11 — End: ?

## 2016-02-08 ENCOUNTER — Telehealth (INDEPENDENT_AMBULATORY_CARE_PROVIDER_SITE_OTHER): Payer: Self-pay | Admitting: Cardiovascular Disease

## 2016-02-08 NOTE — Telephone Encounter (Signed)
Please see message below and advise. Denies any other symptoms at this time. Thank you.     Stacie Brock 1 hour ago (3:46 PM)    Edmon Crape, [facility] nurse, called from   [phone]    She states that MG had patient on Lasix 20mg  every other day.  Patient decided to stop taking the medication about a week ago.  She was complaining that it was causing her uterine prolapse to worsen and become painful.    Pts weight 9/20 was:  175    Today's weight:  181    Patients feet are swelling.      She would like to know what MG wants to do.    Thank you      OV:     Assessment and Plan     1. Chest Pain    She complains of episode of intermittent chest discomfort.  She was seen at [facility] for Atypical chest discomfort.  She was hospitalized overnight.  Her cardiac enzymes were unremarkable.  She was discharged home.   In light of her history of known coronary artery disease.  I scheduled her to undergo  pharmacological stress test     - ECG 12 lead (Today)  - Lexiscan Nuclear Stress Test; Future    2. Edema   She appears to be mildly volume overloaded.  I placed her on furosemide 20 mg to take every other day.  We will obtain laboratory study including BMP and BNP level    - furosemide (LASIX) 20 MG tablet; 1 tablet every other day  Dispense: 45 tablet; Refill: 3  - B-type Natriuretic Peptide; Future  - Basic Metabolic Panel; Future    3. Palpitations   She experienced infrequent symptom of palpitation. There clinical description of her symptom of palpitation, is suggestive of extra systolic beats, most likely PVCs.    4. Coronary Artery Disease - S/P Stent 2007    Complains of chest pain as mentioned above.     - ECG 12 lead (Today)  - Lexiscan Nuclear Stress Test; Future  - metoprolol XL (TOPROL-XL) 50 MG 24 hr tablet; Take 1 tablet (50 mg total) by mouth daily.  Dispense: 90 tablet; Refill: 3    5. Essential Hypertension    Blood Pressure appears well controlled, I have  advised the patient to continue the present medical regimen.    6. Hyperlipidemia LDL goal <70  Lipid Panel 0/9811: Total Cholesterol: 190, Triglycerides: 298, HDL: 50, LDL: 80 mg/dL   I have advised the patient to continue to follow up with their Primary Care Provider for routine lipid and liver function testing.    7. Type 2 Diabetes Mellitus

## 2016-02-08 NOTE — Telephone Encounter (Signed)
Edmon Crape, [facility] nurse, called from   [phone]    She states that MG had patient on Lasix 20mg  every other day.  Patient decided to stop taking the medication about a week ago.  She was complaining that it was causing her uterine prolapse to worsen and become painful.    Pts weight 9/20 was:  175    Today's weight:  181    Patients feet are swelling.      She would like to know what MG wants to do.    Thank you

## 2016-02-14 NOTE — Telephone Encounter (Signed)
Please advise patient she needs to resume Lasix 20 mg QOD   Lasix does not worsen Uterine Prolapse it simply causes her to have to urinate more frequently    This needs to be taken to get rid of the fluid she is holding.    She should reduce fluid and salt intake as well.

## 2016-02-14 NOTE — Telephone Encounter (Signed)
Pt's daughter has been advised and expressed understanding.     Attempted to contact pt at [phone] per daughter's request to advise pt as well. Lmtcb to discuss.

## 2016-03-21 ENCOUNTER — Other Ambulatory Visit: Payer: Self-pay | Admitting: Family Medicine

## 2016-03-21 NOTE — Telephone Encounter (Signed)
Refill for ACCU-CHEK AVIVA PLUS STRIPS Qyt. 300 per pharmacy request.

## 2017-05-19 IMAGING — CT CT ABD-PELV W/O CM
2 of 4 series · 16 of 46 positions shown, 18 images · non-contrast
Comparison: CT scan of November 30, 2014.

CLINICAL DATA: Right posterior hip pain after fall 3 days ago.

EXAM:
CT ABDOMEN AND PELVIS WITHOUT CONTRAST
TECHNIQUE: Multidetector CT imaging of the abdomen and pelvis was performed
following the standard protocol without IV contrast.

[Series 2: abd/pelvis 5.0 b31f · axial · 0.81mm/px · z∈[+832,+1307]mm · 13 of 105 slices shown, 15 images]
[im 5/105  soft-tissue]
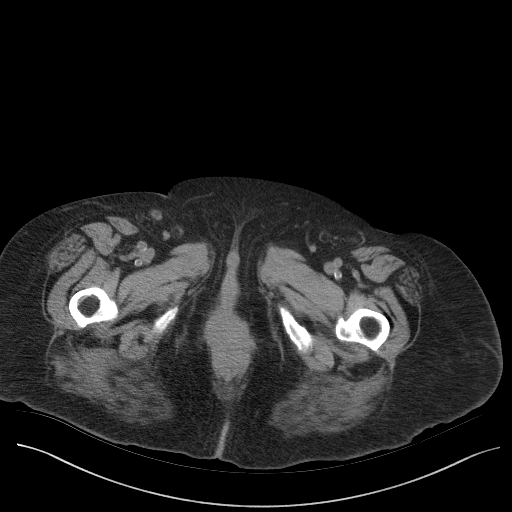
[im 5/105  bone]
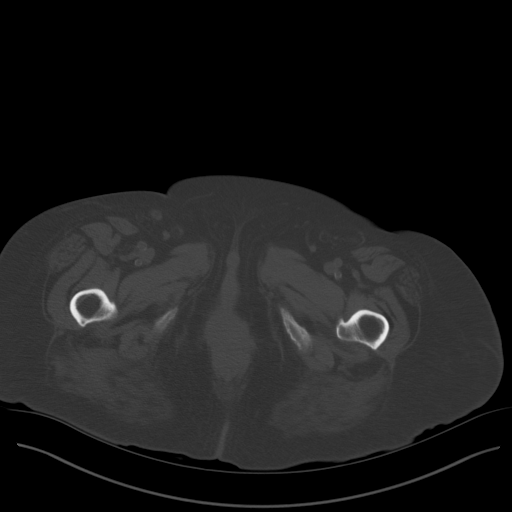
[im 14/105  soft-tissue]
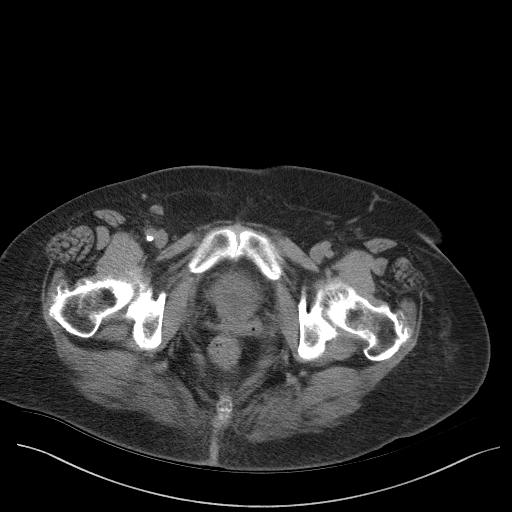
[im 23/105  soft-tissue]
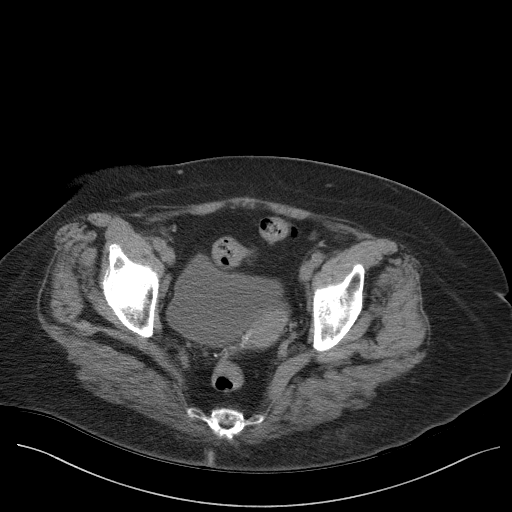
[im 28/105  soft-tissue]
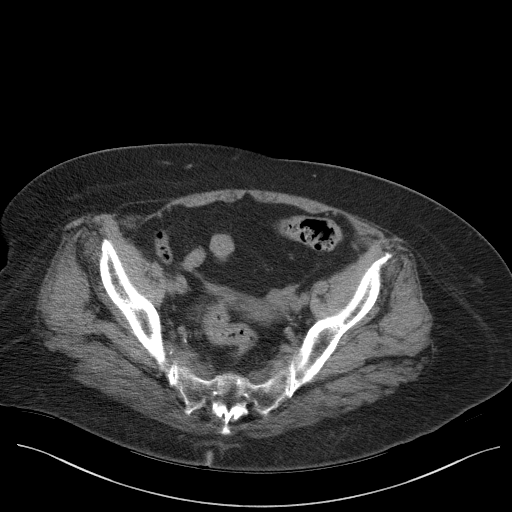
[im 37/105  soft-tissue]
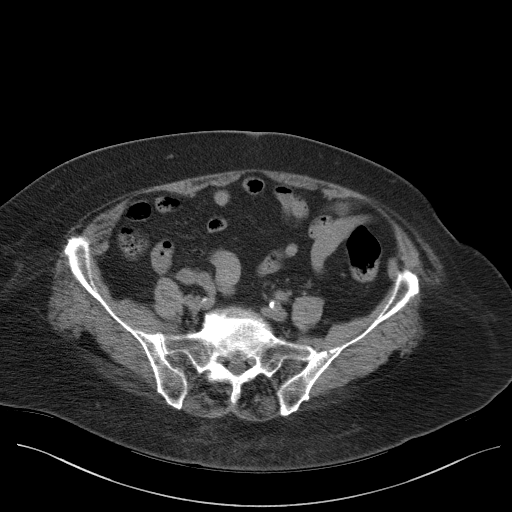
[im 46/105  soft-tissue]
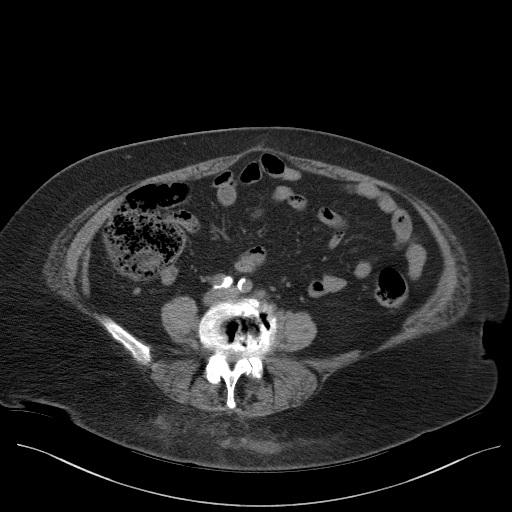
[im 55/105  soft-tissue]
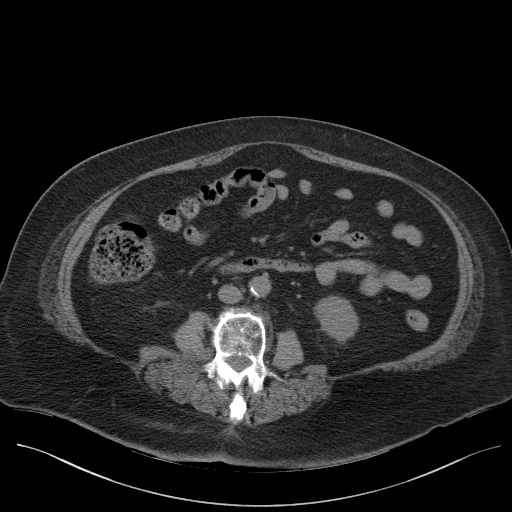
[im 59/105  soft-tissue]
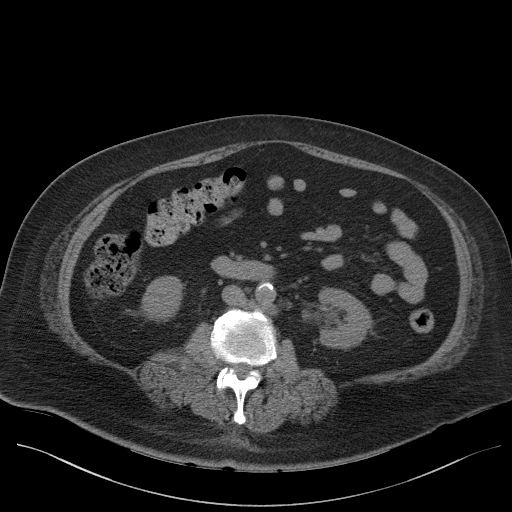
[im 68/105  soft-tissue]
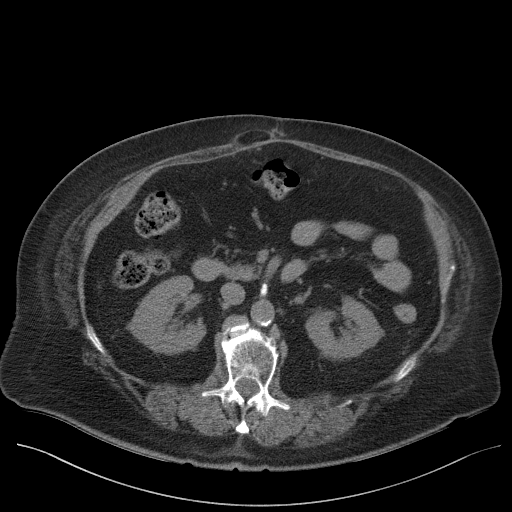
[im 68/105  bone]
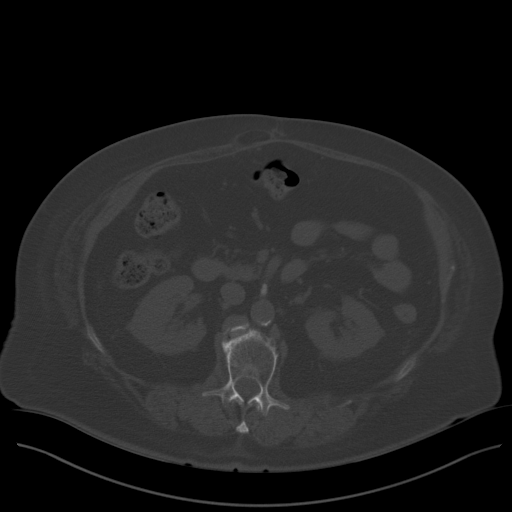
[im 77/105  soft-tissue]
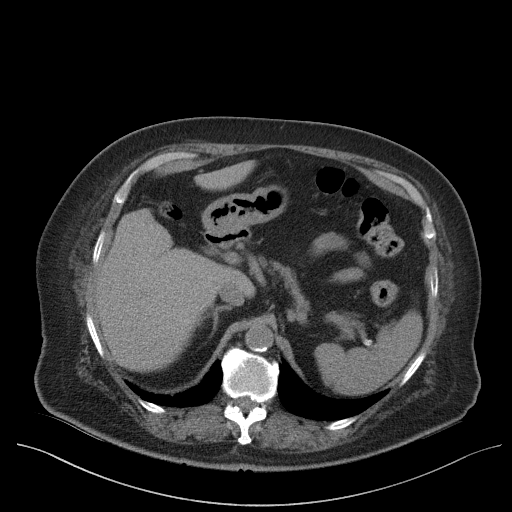
[im 82/105  soft-tissue]
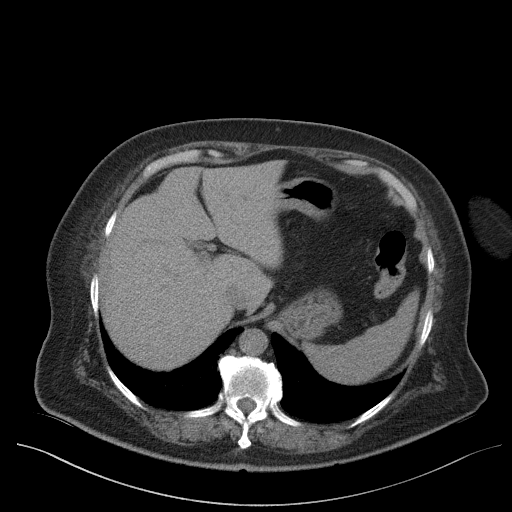
[im 91/105  soft-tissue]
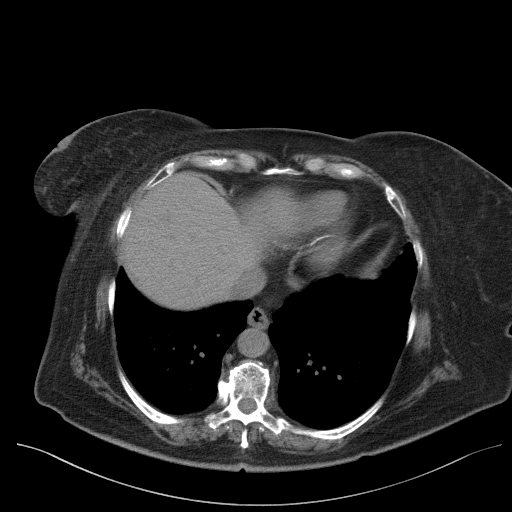
[im 100/105  soft-tissue]
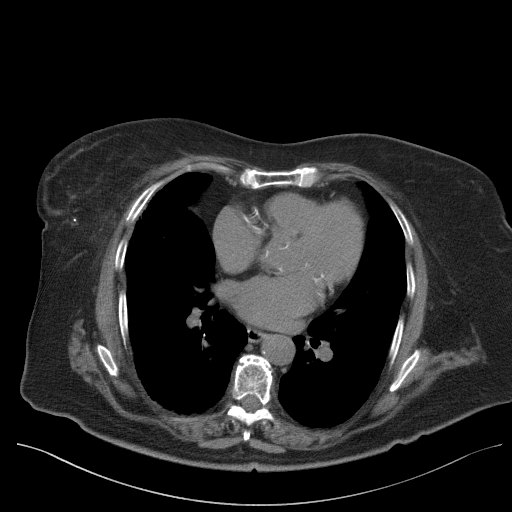

[Series 5: abd/pelvis 3.0 coronal · coronal · 0.89mm/px · 3 of 97 slices shown]
[im 33/97  soft-tissue]
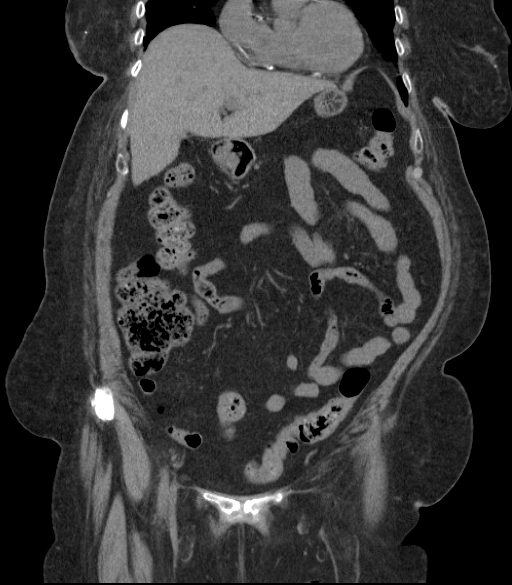
[im 43/97  soft-tissue]
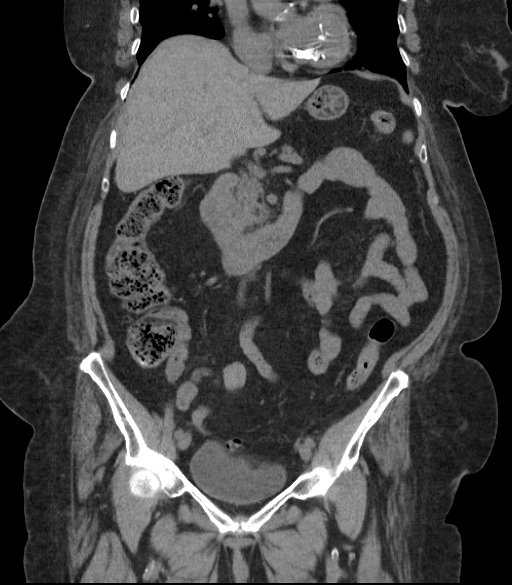
[im 54/97  soft-tissue]
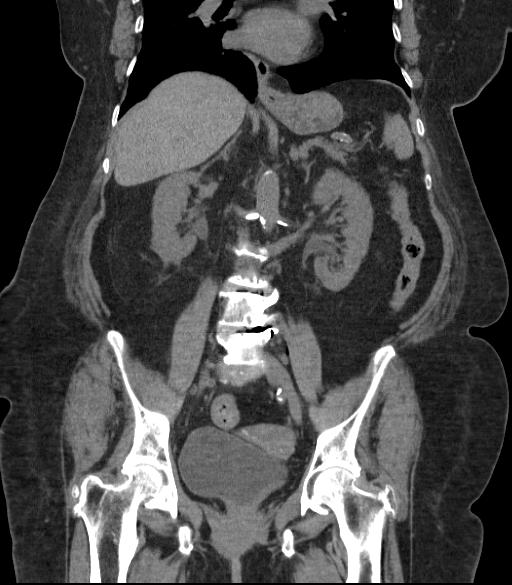

[16 of 46 positions shown; findings below may reference images not displayed]

FINDINGS: Multilevel degenerative disc disease is noted in the lumbar spine.
Stable 6 mm nodule is noted anteriorly in the right lung base.

Status post cholecystectomy. No focal abnormality seen in the liver,
spleen or pancreas on these unenhanced images. Small fat containing
ventral hernia is noted in the epigastric region. This is stable
compared to prior exam. Adrenal glands appear normal. No
hydronephrosis or renal obstruction is noted. No renal or ureteral
calculi are noted. Atherosclerosis of abdominal aorta is noted
without aneurysm formation. Appendix appears normal. Uterus and
ovaries are unremarkable. Urinary bladder appears normal. No
significant adenopathy is noted.
IMPRESSION: Stable small fat containing ventral hernia seen in epigastric
region.

Atherosclerosis of abdominal aorta without aneurysm formation.

Stable 6 mm nodule seen in right lung base. If the patient is at
high risk for bronchogenic carcinoma, follow-up chest CT at 6-12
months is recommended. If the patient is at low risk for
bronchogenic carcinoma, follow-up chest CT at 12 months is
recommended. This recommendation follows the consensus statement:
Guidelines for Management of Small Pulmonary Nodules Detected on CT
Scans: A Statement from the [HOSPITAL] as published in

No other significant abnormality seen in the abdomen or pelvis.
